# Patient Record
Sex: Female | Born: 1985 | Race: Black or African American | Hispanic: No | Smoking: Never smoker
Health system: Southern US, Community
[De-identification: ages and names within clinical notes are randomized; demographics above are authoritative.]

## PROBLEM LIST (undated history)

## (undated) DIAGNOSIS — L309 Dermatitis, unspecified: Secondary | ICD-10-CM

## (undated) DIAGNOSIS — J45909 Unspecified asthma, uncomplicated: Secondary | ICD-10-CM

## (undated) HISTORY — DX: Dermatitis, unspecified: L30.9

## (undated) HISTORY — DX: Unspecified asthma, uncomplicated: J45.909

---

## 2008-12-08 ENCOUNTER — Emergency Department (HOSPITAL_COMMUNITY): Admission: EM | Admit: 2008-12-08 | Discharge: 2008-12-08 | Payer: Self-pay | Admitting: Family Medicine

## 2009-10-20 ENCOUNTER — Emergency Department (HOSPITAL_COMMUNITY): Admission: EM | Admit: 2009-10-20 | Discharge: 2009-10-20 | Payer: Self-pay | Admitting: Emergency Medicine

## 2009-10-23 ENCOUNTER — Emergency Department (HOSPITAL_COMMUNITY): Admission: EM | Admit: 2009-10-23 | Discharge: 2009-10-23 | Payer: Self-pay | Admitting: Emergency Medicine

## 2010-05-09 LAB — GC/CHLAMYDIA PROBE AMP, GENITAL: Chlamydia, DNA Probe: POSITIVE — AB

## 2010-05-09 LAB — WET PREP, GENITAL: Yeast Wet Prep HPF POC: NONE SEEN

## 2016-09-13 ENCOUNTER — Ambulatory Visit (INDEPENDENT_AMBULATORY_CARE_PROVIDER_SITE_OTHER): Payer: Worker's Compensation

## 2016-09-13 ENCOUNTER — Ambulatory Visit (INDEPENDENT_AMBULATORY_CARE_PROVIDER_SITE_OTHER): Payer: Worker's Compensation | Admitting: Physician Assistant

## 2016-09-13 ENCOUNTER — Encounter: Payer: Self-pay | Admitting: Physician Assistant

## 2016-09-13 VITALS — BP 140/90 | HR 112 | Temp 97.1°F | Resp 16 | Ht 66.0 in | Wt 225.0 lb

## 2016-09-13 DIAGNOSIS — S99912A Unspecified injury of left ankle, initial encounter: Secondary | ICD-10-CM

## 2016-09-13 DIAGNOSIS — T148XXA Other injury of unspecified body region, initial encounter: Secondary | ICD-10-CM

## 2016-09-13 DIAGNOSIS — S93492A Sprain of other ligament of left ankle, initial encounter: Secondary | ICD-10-CM

## 2016-09-13 DIAGNOSIS — M62838 Other muscle spasm: Secondary | ICD-10-CM | POA: Diagnosis not present

## 2016-09-13 MED ORDER — NAPROXEN 500 MG PO TABS: 500.0000 mg | ORAL_TABLET | Freq: Two times a day (BID) | ORAL | 0 refills | Status: DC

## 2016-09-13 MED ORDER — CYCLOBENZAPRINE HCL 5 MG PO TABS: 5.0000 mg | ORAL_TABLET | Freq: Three times a day (TID) | ORAL | 0 refills | Status: DC | PRN

## 2016-09-13 NOTE — Patient Instructions (Addendum)
For the ankle, I want you to wear the ankle brace we gave you in office until symptoms resolve. Elevate the foot above your heart level. Apply ice to affected area 4-5 times per day for 20 minutes at a time. Take naproxen as prescribed twice daily until symptoms resolve. You can use crutches as needed. Advance weight as tolerated. For neck spasm, use muscle relaxant up to three times a day. Follow up with me on 09/17/16 for reevaluation. If any of your symptoms worsen please seek care sooner.   Once your symptoms start to improve, begin the exercises below with your ankle to strengthen the ligaments.  Ankle Sprain An ankle sprain is a stretch or tear in one of the tough tissues (ligaments) in your ankle. Follow these instructions at home:  Rest your ankle.  Take over-the-counter and prescription medicines only as told by your doctor.  For 2-3 days, keep your ankle higher than the level of your heart (elevated) as much as possible.  If directed, put ice on the area: ? Put ice in a plastic bag. ? Place a towel between your skin and the bag. ? Leave the ice on for 20 minutes, 2-3 times a day.  If you were given a brace: ? Wear it as told. ? Take it off to shower or bathe. ? Try not to move your ankle much, but wiggle your toes from time to time. This helps to prevent swelling.  If you were given an elastic bandage (dressing): ? Take it off when you shower or bathe. ? Try not to move your ankle much, but wiggle your toes from time to time. This helps to prevent swelling. ? Adjust the bandage to make it more comfortable if it feels too tight. ? Loosen the bandage if you lose feeling in your foot, your foot tingles, or your foot gets cold and blue.  If you have crutches, use them as told by your doctor. Continue to use them until you can walk without feeling pain in your ankle. Contact a doctor if:  Your bruises or swelling are quickly getting worse.  Your pain does not get better after  you take medicine. Get help right away if:  You cannot feel your toes or foot.  Your toes or your foot looks blue.  You have very bad pain that gets worse. This information is not intended to replace advice given to you by your health care provider. Make sure you discuss any questions you have with your health care provider. Document Released: 07/10/2007 Document Revised: 06/29/2015 Document Reviewed: 08/23/2014 Elsevier Interactive Patient Education  2018 Elsevier Inc.   Ankle Sprain, Phase I Rehab Ask your health care provider which exercises are safe for you. Do exercises exactly as told by your health care provider and adjust them as directed. It is normal to feel mild stretching, pulling, tightness, or discomfort as you do these exercises, but you should stop right away if you feel sudden pain or your pain gets worse.Do not begin these exercises until told by your health care provider. Stretching and range of motion exercises These exercises warm up your muscles and joints and improve the movement and flexibility of your lower leg and ankle. These exercises also help to relieve pain and stiffness. Exercise A: Gastroc and soleus stretch  1. Sit on the floor with your left / right leg extended. 2. Loop a belt or towel around the ball of your left / right foot. The ball of your foot is  on the walking surface, right under your toes. 3. Keep your left / right ankle and foot relaxed and keep your knee straight while you use the belt or towel to pull your foot toward you. You should feel a gentle stretch behind your calf or knee. 4. Hold this position for __________ seconds, then release to the starting position. Repeat the exercise with your knee bent. You can put a pillow or a rolled bath towel under your knee to support it. You should feel a stretch deep in your calf or at your Achilles tendon. Repeat each stretch __________ times. Complete these stretches __________ times a day. Exercise  B: Ankle alphabet  1. Sit with your left / right leg supported at the lower leg. ? Do not rest your foot on anything. ? Make sure your foot has room to move freely. 2. Think of your left / right foot as a paintbrush, and move your foot to trace each letter of the alphabet in the air. Keep your hip and knee still while you trace. Make the letters as large as you can without feeling discomfort. 3. Trace every letter from A to Z. Repeat __________ times. Complete this exercise __________ times a day. Strengthening exercises These exercises build strength and endurance in your ankle and lower leg. Endurance is the ability to use your muscles for a long time, even after they get tired. Exercise C: Dorsiflexors  1. Secure a rubber exercise band or tube to an object, such as a table leg, that will stay still when the band is pulled. Secure the other end around your left / right foot. 2. Sit on the floor facing the object, with your left / right leg extended. The band or tube should be slightly tense when your foot is relaxed. 3. Slowly bring your foot toward you, pulling the band tighter. 4. Hold this position for __________ seconds. 5. Slowly return your foot to the starting position. Repeat __________ times. Complete this exercise __________ times a day. Exercise D: Plantar flexors  1. Sit on the floor with your left / right leg extended. 2. Loop a rubber exercise tube or band around the ball of your left / right foot. The ball of your foot is on the walking surface, right under your toes. ? Hold the ends of the band or tube in your hands. ? The band or tube should be slightly tense when your foot is relaxed. 3. Slowly point your foot and toes downward, pushing them away from you. 4. Hold this position for __________ seconds. 5. Slowly return your foot to the starting position. Repeat __________ times. Complete this exercise __________ times a day. Exercise E: Evertors 1. Sit on the floor  with your legs straight out in front of you. 2. Loop a rubber exercise band or tube around the ball of your left / right foot. The ball of your foot is on the walking surface, right under your toes. ? Hold the ends of the band in your hands, or secure the band to a stable object. ? The band or tube should be slightly tense when your foot is relaxed. 3. Slowly push your foot outward, away from your other leg. 4. Hold this position for __________ seconds. 5. Slowly return your foot to the starting position. Repeat __________ times. Complete this exercise __________ times a day. This information is not intended to replace advice given to you by your health care provider. Make sure you discuss any questions you have with  your health care provider. Document Released: 08/22/2004 Document Revised: 09/28/2015 Document Reviewed: 12/05/2014 Elsevier Interactive Patient Education  2018 Elsevier Inc.   Cervical Strain and Sprain Rehab Ask your health care provider which exercises are safe for you. Do exercises exactly as told by your health care provider and adjust them as directed. It is normal to feel mild stretching, pulling, tightness, or discomfort as you do these exercises, but you should stop right away if you feel sudden pain or your pain gets worse.Do not begin these exercises until told by your health care provider. Stretching and range of motion exercises These exercises warm up your muscles and joints and improve the movement and flexibility of your neck. These exercises also help to relieve pain, numbness, and tingling. Exercise A: Cervical side bend  5. Using good posture, sit on a stable chair or stand up. 6. Without moving your shoulders, slowly tilt your left / right ear to your shoulder until you feel a stretch in your neck muscles. You should be looking straight ahead. 7. Hold for __________ seconds. 8. Repeat with the other side of your neck. Repeat __________ times. Complete this  exercise __________ times a day. Exercise B: Cervical rotation  4. Using good posture, sit on a stable chair or stand up. 5. Slowly turn your head to the side as if you are looking over your left / right shoulder. ? Keep your eyes level with the ground. ? Stop when you feel a stretch along the side and the back of your neck. 6. Hold for __________ seconds. 7. Repeat this by turning to your other side. Repeat __________ times. Complete this exercise __________ times a day. Exercise C: Thoracic extension and pectoral stretch 6. Roll a towel or a small blanket so it is about 4 inches (10 cm) in diameter. 7. Lie down on your back on a firm surface. 8. Put the towel lengthwise, under your spine in the middle of your back. It should not be not under your shoulder blades. The towel should line up with your spine from your middle back to your lower back. 9. Put your hands behind your head and let your elbows fall out to your sides. 10. Hold for __________ seconds. Repeat __________ times. Complete this exercise __________ times a day. Strengthening exercises These exercises build strength and endurance in your neck. Endurance is the ability to use your muscles for a long time, even after your muscles get tired. Exercise D: Upper cervical flexion, isometric 1. Lie on your back with a thin pillow behind your head and a small rolled-up towel under your neck. 2. Gently tuck your chin toward your chest and nod your head down to look toward your feet. Do not lift your head off the pillow. 3. Hold for __________ seconds. 4. Release the tension slowly. Relax your neck muscles completely before you repeat this exercise. Repeat __________ times. Complete this exercise __________ times a day. Exercise E: Cervical extension, isometric  1. Stand about 6 inches (15 cm) away from a wall, with your back facing the wall. 2. Place a soft object, about 6-8 inches (15-20 cm) in diameter, between the back of your  head and the wall. A soft object could be a small pillow, a ball, or a folded towel. 3. Gently tilt your head back and press into the soft object. Keep your jaw and forehead relaxed. 4. Hold for __________ seconds. 5. Release the tension slowly. Relax your neck muscles completely before you repeat this exercise.  Repeat __________ times. Complete this exercise __________ times a day. Posture and body mechanics  Body mechanics refers to the movements and positions of your body while you do your daily activities. Posture is part of body mechanics. Good posture and healthy body mechanics can help to relieve stress in your body's tissues and joints. Good posture means that your spine is in its natural S-curve position (your spine is neutral), your shoulders are pulled back slightly, and your head is not tipped forward. The following are general guidelines for applying improved posture and body mechanics to your everyday activities. Standing  When standing, keep your spine neutral and keep your feet about hip-width apart. Keep a slight bend in your knees. Your ears, shoulders, and hips should line up.  When you do a task in which you stand in one place for a long time, place one foot up on a stable object that is 2-4 inches (5-10 cm) high, such as a footstool. This helps keep your spine neutral. Sitting   When sitting, keep your spine neutral and your keep feet flat on the floor. Use a footrest, if necessary, and keep your thighs parallel to the floor. Avoid rounding your shoulders, and avoid tilting your head forward.  When working at a desk or a computer, keep your desk at a height where your hands are slightly lower than your elbows. Slide your chair under your desk so you are close enough to maintain good posture.  When working at a computer, place your monitor at a height where you are looking straight ahead and you do not have to tilt your head forward or downward to look at the  screen. Resting When lying down and resting, avoid positions that are most painful for you. Try to support your neck in a neutral position. You can use a contour pillow or a small rolled-up towel. Your pillow should support your neck but not push on it. This information is not intended to replace advice given to you by your health care provider. Make sure you discuss any questions you have with your health care provider. Document Released: 01/21/2005 Document Revised: 09/28/2015 Document Reviewed: 12/28/2014 Elsevier Interactive Patient Education  2018 [employer].   IF you received an x-ray today, you will receive an invoice from [facility]. Please contact [facility] at [phone] with questions or concerns regarding your invoice.   IF you received labwork today, you will receive an invoice from [facility]. Please contact [facility] at [phone] with questions or concerns regarding your invoice.   Our billing staff will not be able to assist you with questions regarding bills from these companies.  You will be contacted with the lab results as soon as they are available. The fastest way to get your results is to activate your My Chart account. Instructions are located on the last page of this paperwork. If you have not heard from [place] regarding the results in 2 weeks, please contact this office.

## 2016-09-13 NOTE — Progress Notes (Signed)
Debbie Solis  MRN: [id number] DOB: 09/18/1985  Subjective:   Debbie Solis is a 31 y.o. female who presents with left ankle pain. Onset of the symptoms was today. Inciting event: inverted while walking up stairs at work in heels. Current symptoms include: inability to bear weight and swelling. Aggravating factors: direct pressure and walking . Symptoms have stabilized. Patient has had no prior ankle problems. Evaluation to date: none. Treatment to date: none. Pt also have neck tightness on right side. She denies loss of ROM. She also has a minor abrasion on right hand. No pain or swelling of hand.   Review of Systems  Eyes: Negative for visual disturbance.  Neurological: Negative for dizziness, weakness, numbness and headaches.   Debbie Solis's medications list, allergies, past medical history and past surgical history were reviewed and excluded from this note due to being a worker's comp case.     Objective:  BP 140/90   Pulse (!) 112   Temp (!) 97.1 F (36.2 C) (Oral)   Resp 16   Ht 5\' 6"  (1.676 m)   Wt 225 lb (102.1 kg)   LMP 09/05/2016   BMI 36.32 kg/m   Physical Exam  Constitutional: She is oriented to person, place, and time and well-developed, well-nourished, and in no distress.  HENT:  Head: Normocephalic and atraumatic.  Eyes: Pupils are equal, round, and reactive to light. Conjunctivae and EOM are normal.  Neck: Normal range of motion and full passive range of motion without pain. Muscular tenderness (and spasm noted in right sided musculature) present. No spinous process tenderness present. No neck rigidity. No erythema and normal range of motion present.  Pulmonary/Chest: Effort normal.  Musculoskeletal:       Right wrist: Normal.       Left wrist: Normal.       Left ankle: She exhibits decreased range of motion and swelling (mild over ATFL). She exhibits no ecchymosis and normal pulse. Tenderness. AITFL tenderness found. No lateral malleolus, no medial malleolus and no  head of 5th metatarsal tenderness found. Achilles tendon normal.       Right hand: She exhibits normal range of motion, no tenderness and normal capillary refill. Normal sensation noted. Normal strength noted.       Left hand: Normal.       Left foot: There is decreased range of motion. There is no tenderness and normal capillary refill.  Neurological: She is alert and oriented to person, place, and time. Gait normal.  Skin: Skin is warm and dry. Abrasion (small abrasion noted on palmar aspect of right hand) noted.  Psychiatric: Affect normal.  Vitals reviewed.  Mupirocin ointment and bandaid applied to hand abrasion.  Assessment and Plan :  1. Injury of left ankle, initial encounter - DG Foot Complete Left; Future - DG Ankle Complete Left; Future  2. Sprain of anterior talofibular ligament of left ankle, initial encounter No acute findings on plain films. Consistent with ankle sprain. Will treat with P.R.I.C.E principles. Ankle brace applied and crutches given in office. Given educational material for ankle exercises to start once pain resolves. Work restrictions include weight bearing activities. Pt will be working from home until follow up appointment. Follow up on 09/17/16 for reevaluation. - naproxen (NAPROSYN) 500 MG tablet; Take 1 tablet (500 mg total) by mouth 2 (two) times daily with a meal.  Dispense: 30 tablet; Refill: 0 -Apply ASO ankle  3. Neck muscle spasm Encouraged to hydrate. Given educational material for neck stretches. Muscle relaxant prn.  -  cyclobenzaprine (FLEXERIL) 5 MG tablet; Take 1 tablet (5 mg total) by mouth 3 (three) times daily as needed for muscle spasms.  Dispense: 60 tablet; Refill: 0 4 4. Abrasion Wound care instructions given to patient.   Debbie Core PA-C  [facility] 09/13/2016 7:26 PM

## 2016-09-16 ENCOUNTER — Encounter: Payer: Self-pay | Admitting: Physician Assistant

## 2016-09-17 ENCOUNTER — Ambulatory Visit (INDEPENDENT_AMBULATORY_CARE_PROVIDER_SITE_OTHER): Payer: Worker's Compensation | Admitting: Physician Assistant

## 2016-09-17 ENCOUNTER — Encounter: Payer: Self-pay | Admitting: Physician Assistant

## 2016-09-17 VITALS — BP 143/87 | HR 99 | Temp 98.6°F | Resp 17 | Ht 66.0 in | Wt 230.0 lb

## 2016-09-17 DIAGNOSIS — S93492D Sprain of other ligament of left ankle, subsequent encounter: Secondary | ICD-10-CM | POA: Diagnosis not present

## 2016-09-17 DIAGNOSIS — S99912D Unspecified injury of left ankle, subsequent encounter: Secondary | ICD-10-CM | POA: Diagnosis not present

## 2016-09-17 NOTE — Progress Notes (Signed)
MRN: [id number] DOB: [birth year]  Subjective:   Debbie Solis is a 31 y.o. female presenting for chief complaint of Follow-up (Ankle injury left)  Pt initially seen in office on 09/13/16 for left ankle injury. Plain films of left ankle and foot were negative for acute fracture. She was treated for left ankle sprain with P.R.I.C.E principles. Given Rx for naproxen for ankle pain and swelling and for flexeril for neck spasms. Today, she notes the ankle pain and swelling have improved. Pain was 8/10, now it is 4/10. 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 This did irritate her ankle. Woke up this morning and it was sore again. Has been wearing brace, icing, and elevating as instructed. Did some ankle ROM exercises over the weekend without pain. Using naproxen prn. 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 Denies numbness, tingling, and worsening pain.  In terms of her neck stiffness, she notes that has completley improved, had to use muslce relaxant twice.   Debbie Solis's medications list, allergies, past medical history and past surgical history were reviewed and excluded from this note due to being a worker's comp case.   Objective:   Vitals: BP (!) 143/87   Pulse 99   Temp 98.6 F (37 C) (Oral)   Resp 17   Ht 5\' 6"  (1.676 m)   Wt 230 lb (104.3 kg)   LMP 09/05/2016   SpO2 98%   BMI 37.12 kg/m   Physical Exam  Constitutional: She is oriented to person, place, and time. She appears well-developed and well-nourished.  HENT:  Head: Normocephalic and atraumatic.  Eyes: Conjunctivae are normal.  Neck: Normal range of motion.  Cardiovascular:  Pulses:      Dorsalis pedis pulses are 2+ on the right side, and 2+ on the left  side.       Posterior tibial pulses are 2+ on the right side, and 2+ on the left side.  Pulmonary/Chest: Effort normal.  Musculoskeletal:       Left ankle: She exhibits swelling (mild but improved since initial visit) and ecchymosis (noted over ATFL and at base of 3rd and 4th toe). She exhibits normal range of motion and normal pulse. Tenderness. AITFL (mild) tenderness found. No lateral malleolus and no medial malleolus tenderness found. Achilles tendon normal.       Left foot: There is tenderness (mild with palpation of metatarsals). There is normal range of motion and normal capillary refill.  Neurological: She is alert and oriented to person, place, and time.  Using crutches to assist with ambulation.   Skin: Skin is warm and dry.  Psychiatric: She has a normal mood and affect.  Vitals reviewed.   No results found for this or any previous visit (from the past 24 hour(s)).  Assessment and Plan :  1. Injury of left ankle, subsequent encounter 2. Sprain of anterior talofibular ligament of left ankle, subsequent encounter Pt improving. Due to the fact that she has to start school this week and will not be able to ambulate easily with crutches on campus due to construction, cam walker given to pt in office. Will only use cam walker as needed for the next week. Also filled out paperwork for  temporary handicap placard to use at campus over the next few weeks. Instructed while at home to use ankle brace as needed and continue icing and elevating. Also encouraged to continue ROM exercises. Continue weight bearing as tolerated. Work restrictions given. Plan to follow up in one week for reevaluation.  - Apply cam walker  Benjiman CoreBrittany Jenee Spaugh, PA-C  [facility] 09/17/2016 11:54 AM

## 2016-09-17 NOTE — Patient Instructions (Addendum)
I would like you to use the CAM Walker while on campus for the next week. While you are at home make sure you are elevating your leg, icing, and doing ankle exercises as tolerated. Use naproxen as needed for inflammation and pain. While at work, elevate your foot every few hours for at least 30 minutes at a time. Apply ice at work if needed. Start bearing weight on ankle as tolerated. Follow up in one week for reevaluation. Thank you for letting me participate in your health and well being.  Ankle Sprain An ankle sprain is a stretch or tear in one of the tough tissues (ligaments) in your ankle. Follow these instructions at home:  Rest your ankle.  Take over-the-counter and prescription medicines only as told by your doctor.  For 2-3 days, keep your ankle higher than the level of your heart (elevated) as much as possible.  If directed, put ice on the area: ? Put ice in a plastic bag. ? Place a towel between your skin and the bag. ? Leave the ice on for 20 minutes, 2-3 times a day.  If you were given a brace: ? Wear it as told. ? Take it off to shower or bathe. ? Try not to move your ankle much, but wiggle your toes from time to time. This helps to prevent swelling.  If you were given an elastic bandage (dressing): ? Take it off when you shower or bathe. ? Try not to move your ankle much, but wiggle your toes from time to time. This helps to prevent swelling. ? Adjust the bandage to make it more comfortable if it feels too tight. ? Loosen the bandage if you lose feeling in your foot, your foot tingles, or your foot gets cold and blue.  If you have crutches, use them as told by your doctor. Continue to use them until you can walk without feeling pain in your ankle. Contact a doctor if:  Your bruises or swelling are quickly getting worse.  Your pain does not get better after you take medicine. Get help right away if:  You cannot feel your toes or foot.  Your toes or your foot looks  blue.  You have very bad pain that gets worse. This information is not intended to replace advice given to you by your health care provider. Make sure you discuss any questions you have with your health care provider. Document Released: 07/10/2007 Document Revised: 06/29/2015 Document Reviewed: 08/23/2014 Elsevier Interactive Patient Education  2018 [employer]     IF you received an x-ray today, you will receive an invoice from [facility]. Please contact [facility] at [phone] with questions or concerns regarding your invoice.   IF you received labwork today, you will receive an invoice from [facility]. Please contact [facility] at [phone] with questions or concerns regarding your invoice.   Our billing staff will not be able to assist you with questions regarding bills from these companies.  You will be contacted with the lab results as soon as they are available. The fastest way to get your results is to activate your My Chart account. Instructions are located on the last page of this paperwork. If you have not heard from us regarding the results in 2 weeks, please contact this office.

## 2016-09-24 ENCOUNTER — Other Ambulatory Visit: Payer: Self-pay | Admitting: Physician Assistant

## 2016-09-24 DIAGNOSIS — S93492A Sprain of other ligament of left ankle, initial encounter: Secondary | ICD-10-CM

## 2016-10-02 ENCOUNTER — Ambulatory Visit (INDEPENDENT_AMBULATORY_CARE_PROVIDER_SITE_OTHER): Payer: Worker's Compensation

## 2016-10-02 ENCOUNTER — Ambulatory Visit (INDEPENDENT_AMBULATORY_CARE_PROVIDER_SITE_OTHER): Payer: Worker's Compensation | Admitting: Physician Assistant

## 2016-10-02 ENCOUNTER — Encounter: Payer: Self-pay | Admitting: Physician Assistant

## 2016-10-02 VITALS — BP 123/85 | HR 80 | Temp 98.2°F | Resp 18 | Ht 66.0 in | Wt 229.2 lb

## 2016-10-02 DIAGNOSIS — S93492D Sprain of other ligament of left ankle, subsequent encounter: Secondary | ICD-10-CM

## 2016-10-02 DIAGNOSIS — M79672 Pain in left foot: Secondary | ICD-10-CM

## 2016-10-02 DIAGNOSIS — S99912D Unspecified injury of left ankle, subsequent encounter: Secondary | ICD-10-CM | POA: Diagnosis not present

## 2016-10-02 NOTE — Patient Instructions (Addendum)
Your plain films are still negative for acute fracture, which is great news. I recommend using the ASO wrap when walking around for long periods at a time. I would also continue with daily naproxen at least for the next week. Continue resting, icing, and elevating until swelling and pain resolves. I recommend performing ankle exercises given at previous office visits. Follow up as needed. Thank you for letting me participate in your health and well being.    IF you received an x-ray today, you will receive an invoice from [facility]. Please contact [facility] at [phone] with questions or concerns regarding your invoice.   IF you received labwork today, you will receive an invoice from [facility]. Please contact [facility] at [phone] with questions or concerns regarding your invoice.   Our billing staff will not be able to assist you with questions regarding bills from these companies.  You will be contacted with the lab results as soon as they are available. The fastest way to get your results is to activate your My Chart account. Instructions are located on the last page of this paperwork. If you have not heard from us regarding the results in 2 weeks, please contact this office.

## 2016-10-02 NOTE — Progress Notes (Signed)
MRN: [id number] DOB: 08/25/1985  Subjective:   Debbie Solis is a 31 y.o. female presenting for follow up on ankle injury. Pt initially seen in office on 09/13/16 for left ankle injury. Plain films of left ankle and foot were negative for acute fracture. She was treated for left ankle sprain with P.R.I.C.E principles. Given Rx for naproxen for ankle pain and swelling and for flexeril for neck spasms. 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 Also, had to park far away from campus due to construction. Given a temporary short cam walker to use for one week and a temporary handicap placard so she could park closer to campus. Encouraged to continue ROM exercises, elevation, and ice. Return in one week for reevaluation. Today, pt notes her symptoms have improved but she still notices left foot pain and swelling, especially at the end of the day after being on her feet all day. She has not had to use crutches since her last visit. She stoped using cam walker a few days ago. Also, stopped using compression aso a few days ago. Has not continued with naproxen. Will ice and elevate occasionally but not regularly. Had tried ankle ROM exercises occasionally. Denies numbness, tingling, and worsening pain.   Layan's medications list, allergies, past medical history and past surgical history were reviewed and excluded from this note due to being a worker's comp case.    Objective:   Vitals: BP 123/85 (BP Location: Left Arm, Patient Position: Sitting, Cuff Size: Large)   Pulse 80   Temp 98.2 F (36.8 C) (Oral)   Resp 18   Ht 5\' 6"  (1.676 m)   Wt 229 lb 3.2 oz (104 kg)   LMP 09/05/2016 (Approximate)   SpO2 99%   BMI 36.99 kg/m   Physical Exam  Constitutional: She is oriented to person, place, and time. She appears well-developed and well-nourished.  HENT:  Head: Normocephalic  and atraumatic.  Eyes: Conjunctivae are normal.  Neck: Normal range of motion.  Cardiovascular:  Pulses:      Dorsalis pedis pulses are 2+ on the right side, and 2+ on the left side.       Posterior tibial pulses are 2+ on the right side, and 2+ on the left side.  Pulmonary/Chest: Effort normal.  Musculoskeletal:       Left ankle: She exhibits normal range of motion, no swelling and no ecchymosis. Tenderness. AITFL (mild) tenderness found. No lateral malleolus and no medial malleolus tenderness found. Achilles tendon normal.       Left foot: There is bony tenderness (with palpation of 4th and 5th metatarsal) and swelling (mild over midfoot). There is normal range of motion and normal capillary refill.  Neurological: She is alert and oriented to person, place, and time.  Skin: Skin is warm and dry.  Psychiatric: She has a normal mood and affect.  Vitals reviewed.   No results found for this or any previous visit (from the past 24 hour(s)).  Dg Foot Complete Left  Result Date: 10/02/2016 CLINICAL DATA:  Continued fourth and fifth metatarsal pain after ankle inversion injury 3 weeks ago. EXAM: LEFT FOOT - COMPLETE 3+ VIEW COMPARISON:  Left foot x-rays dated September 13, 2016. FINDINGS: There is no evidence of fracture or dislocation. There is no evidence of arthropathy or other focal bone abnormality. Soft tissues are unremarkable. IMPRESSION: Negative.  Electronically Signed   By: Obie Dredge M.D.   On: 10/02/2016 12:42     Assessment and Plan :  1. Left foot pain 2. Injury of left ankle, subsequent encounter 3. Sprain of anterior talofibular ligament of left ankle, subsequent encounter Repeated imaging due to hx of continued pain, swelling, and PE findings of tenderness with palpation of 4th and 5th metatarsals. There was still no acute fx noted on exam, which is reassuring. I have encouraged patient to continue wearing aso brace as needed for support and swelling, especially if she is  walking a lot on campus. I have also encouraged her to take naproxen daily for at least the next week to help with swelling and pain. Continue rest, ice, elevation, and ROM exercises. Plan to follow up 4 weeks. Expect sx to have fully resolved by that time. If no full improvement at that time, consider referral to orthopedics. Return sooner if symptoms worsen or if they stop gradually improving.  - DG Foot Complete Left; Future  Benjiman Core, PA-C  Primary Care at [facility] 10/03/2016 11:01 PM

## 2016-10-26 ENCOUNTER — Ambulatory Visit (INDEPENDENT_AMBULATORY_CARE_PROVIDER_SITE_OTHER): Payer: Worker's Compensation | Admitting: Physician Assistant

## 2016-10-26 ENCOUNTER — Encounter: Payer: Self-pay | Admitting: Physician Assistant

## 2016-10-26 VITALS — BP 127/85 | HR 88 | Temp 98.4°F | Resp 16 | Ht 64.0 in | Wt 234.8 lb

## 2016-10-26 DIAGNOSIS — G8929 Other chronic pain: Secondary | ICD-10-CM | POA: Diagnosis not present

## 2016-10-26 DIAGNOSIS — M79672 Pain in left foot: Secondary | ICD-10-CM

## 2016-10-26 DIAGNOSIS — M25572 Pain in left ankle and joints of left foot: Secondary | ICD-10-CM | POA: Diagnosis not present

## 2016-10-26 NOTE — Progress Notes (Deleted)
    MRN: [id number] DOB: 1985/05/28  Subjective:   Debbie Solis is a 31 y.o. female presenting for follow up on ***.     Debbie Solis currently has no medications in their medication list. Also is allergic to alka-seltzer heartburn [sodium bicarbonate-citric acid].  Debbie Solis  has no past medical history on file. Also  has no past surgical history on file.   Objective:   Vitals: BP 127/85   Pulse 88   Temp 98.4 F (36.9 C) (Oral)   Resp 16   Ht  (1.626 m)   Wt 234 lb 12.8 oz (106.5 kg)   LMP 10/05/2016   SpO2 97%   BMI 40.30 kg/m   Physical Exam  No results found for this or any previous visit (from the past 24 hour(s)).  Assessment and Plan :  There are no diagnoses linked to this encounter.  Benjiman Core, PA-C  [facility] 10/26/2016 11:29 AM

## 2016-10-26 NOTE — Patient Instructions (Addendum)
   Please go pick up compression stockings and wear those while sitting or standing for long periods. Continue icing and elevating. Avoid brace use if it is worsening the pain. Follow up with orthopedics. They should contact you within a week to schedule an appointment. Thank you for letting me participate in your health and well being.  [facility] [employer] for compression stockings Address: [address], [place], [place] [address]  Phone: [phone]    IF you received an x-ray today, you will receive an invoice from [facility]. Please contact [facility] at [phone] with questions or concerns regarding your invoice.   IF you received labwork today, you will receive an invoice from [facility]. Please contact [facility] at [phone] with questions or concerns regarding your invoice.   Our billing staff will not be able to assist you with questions regarding bills from these companies.  You will be contacted with the lab results as soon as they are available. The fastest way to get your results is to activate your My Chart account. Instructions are located on the last page of this paperwork. If you have not heard from [place] regarding the results in 2 weeks, please contact this office.

## 2016-10-26 NOTE — Progress Notes (Deleted)
   Debbie Solis  MRN: [id number] DOB: [birth year]  Subjective:  Debbie Solis is a 31 y.o. female seen in office today for a chief complaint of ***  Review of Systems  There are no active problems to display for this patient.   No current outpatient prescriptions on file prior to visit.   No current facility-administered medications on file prior to visit.     Allergies  Allergen Reactions  . Alka-Seltzer Heartburn [Sodium Bicarbonate-Citric Acid] Swelling     Objective:  BP 127/85   Pulse 88   Temp 98.4 F (36.9 C) (Oral)   Resp 16   Ht  (1.626 m)   Wt 234 lb 12.8 oz (106.5 kg)   LMP 10/05/2016   SpO2 97%   BMI 40.30 kg/m   Physical Exam  Assessment and Plan :  *** 1. Left foot pain *** - Ambulatory referral to Orthopedic Surgery    Benjiman Core PA-C  Primary Care at [facility] 10/26/2016 12:34 PM

## 2016-10-26 NOTE — Progress Notes (Signed)
MRN: [id number] DOB: Nov 07, 1985  Subjective:   Debbie Solis is a 31 y.o. female presenting for chief complaint of Foot Pain (WC follow-up, pt states her foot does not feel any better. Pt states the joint where the ankle and foot meet is giving her the most pain when she stands. )   Pt initially seen in office on 09/13/16 for left ankle injury. Plain films of left ankle and foot were negative for acute fracture. She was treated for left ankle sprain with P.R.I.C.E principles. Given Rx for naproxen for ankle pain and swelling and for flexeril for neck spasms. 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 Also, had to park far away from campus due to construction. Given a temporary short cam walker to use for one week and a temporary handicap placard so she could park closer to campus. Encouraged to continue ROM exercises, elevation, and ice. Returned on 10/03/26 with improved symptoms but continued left foot pain and swelling, especially at the end of the day after being on her feet all day. Reimaged the foot to bony tenderness on exam and reimaging showed no evidence or fracture of dislocation. Today, pt reports she is still having pain deep in her left ankle joint. It is present with any weight bearing activity, although she can bear weight. It is worse after being on her feet all day. Has been using the brace and thinks it makes the pain worse. She still has swelling and mild bruising. Notes she also gets right foot, right leg, and left leg swelling at the end of the day. Pt sits for long periods at a time at her job then walks for long periods when she is on campus for school. Notes the swelling is not present in the morning. Denies redness, warmth, numbness, and tingling. Has continued using ice and elevating left ankle as discussed. Has continued ROM exercises and notes that they do  not cause any pain. Has not had to use NSAIDs for pain.     Avari's medications list, allergies, past medical history and past surgical history were reviewed and excluded from this note due to being a worker's comp case.   Objective:   Vitals: BP 127/85   Pulse 88   Temp 98.4 F (36.9 C) (Oral)   Resp 16   Ht  (1.626 m)   Wt 234 lb 12.8 oz (106.5 kg)   LMP 10/05/2016   SpO2 97%   BMI 40.30 kg/m   Physical Exam  Constitutional: She is oriented to person, place, and time. She appears well-developed and well-nourished.  HENT:  Head: Normocephalic and atraumatic.  Eyes: Conjunctivae are normal.  Neck: Normal range of motion.  Pulmonary/Chest: Effort normal.  Musculoskeletal:       Left ankle: She exhibits swelling (mild overlying ATFL) and ecchymosis (mild overlying ATFL). She exhibits normal range of motion and normal pulse. No tenderness. No lateral malleolus, no medial malleolus and no AITFL tenderness found.       Right lower leg: Normal. She exhibits no tenderness and no swelling.       Left lower leg: She exhibits no tenderness and no swelling.       Left foot: There is tenderness (mild with palpation). There is normal range of motion and normal capillary refill.  Neurological: She is alert and oriented to person, place, and  time.  Skin: Skin is warm and dry.  Psychiatric: She has a normal mood and affect.  Vitals reviewed.   No results found for this or any previous visit (from the past 24 hour(s)).  Assessment and Plan :  1. Left foot pain 2. Chronic pain of left ankle Discontinue ankle brace as that is worsening symptoms. Concerned that pt has not had full improvement by now. Will refer to ortho for further evaluation. Continue icing and elevating. NSAIDs prn. Recommend using compression stockings when sitting/standing for prolonged periods of time. Given written Rx for compression stockings. Given temporary handicap card for 1 more month to help with parking at  campus. Follow up with orthopedics.  - Ambulatory referral to Orthopedic Surgery  Benjiman Core, PA-C  Primary Care at [facility] 10/26/2016 12:43 PM

## 2016-12-31 ENCOUNTER — Emergency Department (HOSPITAL_COMMUNITY)
Admission: EM | Admit: 2016-12-31 | Discharge: 2016-12-31 | Disposition: A | Discharge status: Home / Self Care | Payer: PRIVATE HEALTH INSURANCE | Attending: Emergency Medicine | Admitting: Emergency Medicine

## 2016-12-31 ENCOUNTER — Encounter (HOSPITAL_COMMUNITY): Payer: Self-pay | Admitting: Emergency Medicine

## 2016-12-31 DIAGNOSIS — M7918 Myalgia, other site: Secondary | ICD-10-CM | POA: Diagnosis not present

## 2016-12-31 DIAGNOSIS — Z79899 Other long term (current) drug therapy: Secondary | ICD-10-CM | POA: Diagnosis not present

## 2016-12-31 DIAGNOSIS — H538 Other visual disturbances: Secondary | ICD-10-CM | POA: Insufficient documentation

## 2016-12-31 DIAGNOSIS — R51 Headache: Secondary | ICD-10-CM | POA: Diagnosis not present

## 2016-12-31 DIAGNOSIS — I10 Essential (primary) hypertension: Secondary | ICD-10-CM | POA: Insufficient documentation

## 2016-12-31 LAB — I-STAT CHEM 8, ED
BUN: 8 mg/dL (ref 6–20)
CHLORIDE: 102 mmol/L (ref 101–111)
Calcium, Ion: 1.31 mmol/L (ref 1.15–1.40)
Creatinine, Ser: 0.7 mg/dL (ref 0.44–1.00)
GLUCOSE: 98 mg/dL (ref 65–99)
HEMATOCRIT: 39 % (ref 36.0–46.0)
Hemoglobin: 13.3 g/dL (ref 12.0–15.0)
POTASSIUM: 3.5 mmol/L (ref 3.5–5.1)
Sodium: 140 mmol/L (ref 135–145)
TCO2: 28 mmol/L (ref 22–32)

## 2016-12-31 MED ORDER — HYDROCHLOROTHIAZIDE 25 MG PO TABS: 25.0000 mg | ORAL_TABLET | Freq: Every day | ORAL | 0 refills | Status: DC

## 2016-12-31 NOTE — Discharge Instructions (Signed)
Follow-up with the primary care doctor for further management of your high blood pressure.

## 2016-12-31 NOTE — ED Triage Notes (Addendum)
Patient here from home with complaints of "not feeling well" for 3 days. States that she checked [facility] BP at [facility] and it was elevated. No hx of hypertension. Reports fatigue, headache, and generalized body aches.

## 2016-12-31 NOTE — ED Provider Notes (Signed)
[facility]-EMERGENCY DEPT Provider Note   CSN: [id number] Arrival date & time: 12/31/16  1944     History   Chief Complaint Chief Complaint  Patient presents with  . Hypertension  . Generalized Body Aches  . Headache    HPI Debbie Solis is a 31 y.o. female.  HPI Patient has not felt well for the last 3 days.  [facility] today and checked her blood pressure and it was elevated.  No known history of hypertension.  Says she is felt bad.  Has been having dull headaches in the front of her head.  States they have come on in the morning which is unusual for her.  States she usually gets them at the end of the day.  States it went away with Motrin but the headache came back.  No cough.  States she looked at the blackboard today and was a little blurry.  No dysuria.  No chest pain.  No trouble breathing.  No sick contacts.  No sore throat.  Has had slight myalgias.  No localizing numbness or weakness. History reviewed. No pertinent past medical history.  There are no active problems to display for this patient.   History reviewed. No pertinent surgical history.  OB History    No data available       Home Medications    Prior to Admission medications   Medication Sig Start Date End Date Taking? Authorizing Provider  cholecalciferol (VITAMIN D) 1000 units tablet Take 1,000 Units by mouth daily.   Yes [provider]  vitamin B-12 (CYANOCOBALAMIN) 1000 MCG tablet Take 1,000 mcg by mouth daily.   Yes [provider]  Vitamin Mixture (ESTER-C PO) Take 1 tablet by mouth daily.   Yes [provider]  hydrochlorothiazide (HYDRODIURIL) 25 MG tablet Take 1 tablet (25 mg total) by mouth daily. 12/31/16   Benjiman CorePickering, Matilda Fleig, MD    Family History Family History  Problem Relation Age of Onset  . Hypertension Mother   . Hypertension Father     Social History Social History   Tobacco Use  . Smoking status: Never Smoker    . Smokeless tobacco: Never Used  Substance Use Topics  . Alcohol use: No  . Drug use: No     Allergies   Alka-seltzer heartburn [sodium bicarbonate-citric acid]; Latex; and Ultrasound gel   Review of Systems Review of Systems  Constitutional: Positive for fatigue. Negative for appetite change.  HENT: Negative for congestion.   Eyes: Positive for visual disturbance.  Respiratory: Negative for shortness of breath.   Cardiovascular: Negative for chest pain.  Gastrointestinal: Negative for abdominal pain.  Endocrine: Negative for polyuria.  Genitourinary: Negative for flank pain.  Musculoskeletal: Positive for myalgias. Negative for back pain.  Skin: Negative for rash.  Neurological: Positive for headaches.  Hematological: Negative for adenopathy.  Psychiatric/Behavioral: Negative for confusion.     Physical Exam Updated Vital Signs BP (!) 124/105   Pulse 98   Temp 99.6 F (37.6 C) (Oral)   Resp (!) 22   LMP 12/12/2016   SpO2 100%   Physical Exam  Constitutional: She is oriented to person, place, and time. She appears well-developed.  Patient is overweight  HENT:  Head: Atraumatic.  Eyes: EOM are normal. Pupils are equal, round, and reactive to light.  Neck: Neck supple.  Cardiovascular:  Mild tachycardia  Pulmonary/Chest: Effort normal.  Abdominal: Soft.  Neurological: She is alert and oriented to person, place, and time. GCS  eye subscore is 4. GCS verbal subscore is 5. GCS motor subscore is 6.  Skin: Skin is warm. Capillary refill takes less than 2 seconds.     ED Treatments / Results  Labs (all labs ordered are listed, but only abnormal results are displayed) Labs Reviewed  I-STAT CHEM 8, ED    EKG  EKG Interpretation  Date/Time:  Tuesday December 31 2016 22:04:30 EST Ventricular Rate:  92 PR Interval:    QRS Duration: 73 QT Interval:  321 QTC Calculation: 397 R Axis:   54 Text Interpretation:  Sinus rhythm Confirmed by Benjiman CorePickering, Vash Quezada  [phone]([id number]) on 12/31/2016 10:23:09 PM       Radiology No results found.  Procedures Procedures (including critical care time)  Medications Ordered in ED Medications - No data to display   Initial Impression / Assessment and Plan / ED Course  I have reviewed the triage vital signs and the nursing notes.  Pertinent labs & imaging results that were available during my care of the patient were reviewed by me and considered in my medical decision making (see chart for details).     Patient with headache.  Hypertension.  Also generally feeling bad.  His blood pressure initially elevated but has come down some to around 120/100.  Initially was 187/100.  EKG reassuring and good renal function.  Doubt severe endorgan damage.  Will start on hydrochlorothiazide but will need to follow with primary care doctor.  Also with a generally feeling bad potentially could be viral syndrome or something else starting.  Final Clinical Impressions(s) / ED Diagnoses   Final diagnoses:  Hypertension, unspecified type    ED Discharge Orders        Ordered    hydrochlorothiazide (HYDRODIURIL) 25 MG tablet  Daily     12/31/16 2258       Benjiman CorePickering, Keyarah Mcroy, MD 12/31/16 2309

## 2017-09-26 ENCOUNTER — Other Ambulatory Visit: Payer: Self-pay

## 2017-09-26 ENCOUNTER — Emergency Department (HOSPITAL_COMMUNITY): Payer: PRIVATE HEALTH INSURANCE

## 2017-09-26 ENCOUNTER — Emergency Department (HOSPITAL_COMMUNITY)
Admission: EM | Admit: 2017-09-26 | Discharge: 2017-09-26 | Disposition: A | Discharge status: Home / Self Care | Payer: PRIVATE HEALTH INSURANCE | Attending: Emergency Medicine | Admitting: Emergency Medicine

## 2017-09-26 ENCOUNTER — Encounter (HOSPITAL_COMMUNITY): Payer: Self-pay

## 2017-09-26 DIAGNOSIS — H9319 Tinnitus, unspecified ear: Secondary | ICD-10-CM | POA: Diagnosis not present

## 2017-09-26 DIAGNOSIS — R110 Nausea: Secondary | ICD-10-CM | POA: Insufficient documentation

## 2017-09-26 DIAGNOSIS — M25512 Pain in left shoulder: Secondary | ICD-10-CM | POA: Insufficient documentation

## 2017-09-26 DIAGNOSIS — Z79899 Other long term (current) drug therapy: Secondary | ICD-10-CM | POA: Diagnosis not present

## 2017-09-26 DIAGNOSIS — N3001 Acute cystitis with hematuria: Secondary | ICD-10-CM | POA: Insufficient documentation

## 2017-09-26 DIAGNOSIS — M542 Cervicalgia: Secondary | ICD-10-CM | POA: Diagnosis not present

## 2017-09-26 DIAGNOSIS — Z733 Stress, not elsewhere classified: Secondary | ICD-10-CM | POA: Insufficient documentation

## 2017-09-26 DIAGNOSIS — R5383 Other fatigue: Secondary | ICD-10-CM | POA: Insufficient documentation

## 2017-09-26 DIAGNOSIS — Z91040 Latex allergy status: Secondary | ICD-10-CM | POA: Diagnosis not present

## 2017-09-26 DIAGNOSIS — R51 Headache: Secondary | ICD-10-CM | POA: Diagnosis not present

## 2017-09-26 DIAGNOSIS — R030 Elevated blood-pressure reading, without diagnosis of hypertension: Secondary | ICD-10-CM | POA: Diagnosis present

## 2017-09-26 DIAGNOSIS — H9203 Otalgia, bilateral: Secondary | ICD-10-CM | POA: Insufficient documentation

## 2017-09-26 DIAGNOSIS — R519 Headache, unspecified: Secondary | ICD-10-CM

## 2017-09-26 DIAGNOSIS — R109 Unspecified abdominal pain: Secondary | ICD-10-CM | POA: Diagnosis not present

## 2017-09-26 LAB — BASIC METABOLIC PANEL
ANION GAP: 9 (ref 5–15)
BUN: 7 mg/dL (ref 6–20)
CO2: 25 mmol/L (ref 22–32)
CREATININE: 0.72 mg/dL (ref 0.44–1.00)
Calcium: 9 mg/dL (ref 8.9–10.3)
Chloride: 105 mmol/L (ref 98–111)
GFR calc non Af Amer: >60 mL/min (ref >60)
Glucose, Bld: 112 mg/dL — ABNORMAL HIGH (ref 70–99)
Potassium: 3.8 mmol/L (ref 3.5–5.1)
Sodium: 139 mmol/L (ref 135–145)

## 2017-09-26 LAB — HEPATIC FUNCTION PANEL
ALT: 25 U/L (ref 0–44)
AST: 26 U/L (ref 15–41)
Albumin: 3.6 g/dL (ref 3.5–5.0)
Alkaline Phosphatase: 58 U/L (ref 38–126)
BILIRUBIN DIRECT: 0.1 mg/dL (ref 0.0–0.2)
BILIRUBIN INDIRECT: 0.7 mg/dL (ref 0.3–0.9)
BILIRUBIN TOTAL: 0.8 mg/dL (ref 0.3–1.2)
Total Protein: 7.4 g/dL (ref 6.5–8.1)

## 2017-09-26 LAB — URINALYSIS, ROUTINE W REFLEX MICROSCOPIC
BILIRUBIN URINE: NEGATIVE
GLUCOSE, UA: NEGATIVE mg/dL
Ketones, ur: NEGATIVE mg/dL
NITRITE: NEGATIVE
PROTEIN: NEGATIVE mg/dL
Specific Gravity, Urine: 1.023 (ref 1.005–1.030)
pH: 6 (ref 5.0–8.0)

## 2017-09-26 LAB — CBC
HEMATOCRIT: 38.2 % (ref 36.0–46.0)
HEMOGLOBIN: 11.5 g/dL — AB (ref 12.0–15.0)
MCH: 23.2 pg — ABNORMAL LOW (ref 26.0–34.0)
MCHC: 30.1 g/dL (ref 30.0–36.0)
MCV: 77 fL — ABNORMAL LOW (ref 78.0–100.0)
Platelets: 318 10*3/uL (ref 150–400)
RBC: 4.96 MIL/uL (ref 3.87–5.11)
RDW: 13.9 % (ref 11.5–15.5)
WBC: 8.7 10*3/uL (ref 4.0–10.5)

## 2017-09-26 LAB — PREGNANCY, URINE: PREG TEST UR: NEGATIVE

## 2017-09-26 MED ORDER — PROCHLORPERAZINE EDISYLATE 10 MG/2ML IJ SOLN
10.0000 mg | Freq: Once | INTRAMUSCULAR | Status: AC
  Administered 2017-09-26: 10 mg via INTRAVENOUS
  Filled 2017-09-26: qty 2

## 2017-09-26 MED ORDER — CEPHALEXIN 500 MG PO CAPS: 500.0000 mg | ORAL_CAPSULE | Freq: Two times a day (BID) | ORAL | 0 refills | Status: AC

## 2017-09-26 MED ORDER — DIPHENHYDRAMINE HCL 12.5 MG/5ML PO ELIX
12.5000 mg | ORAL_SOLUTION | Freq: Once | ORAL | Status: AC
Start: 2017-09-26 — End: 2017-09-26
  Administered 2017-09-26: 12.5 mg via ORAL
  Filled 2017-09-26: qty 10

## 2017-09-26 NOTE — ED Notes (Signed)
Spoke with lab about adding on LFT

## 2017-09-26 NOTE — ED Notes (Signed)
Called lab, urine culture added.

## 2017-09-26 NOTE — Discharge Instructions (Addendum)
Thank you for allowing me to care for you today in the Emergency Department.   You can take 600 mg of ibuprofen once every 6 hours for a headache.  Please make sure to take this with food so that it does not upset your stomach.  Take 1 tablet of Keflex 2 times daily for the next 5 days.  Your urine has been sent to the lab for culture.  This can take a few days before the results are received.  Keflex should cover most bacteria that cause urinary tract infections.  This infection could be causing your tiredness and weakness.  You can call 1 of the 3 clinics above to get established with primary care.  They may be able to perform some additional blood work if you continue to feel tired with no energy.  Return to the emergency department if you develop new or worsening symptoms including persistent vomiting, high fever, double vision, loss of vision, if your blood pressure consistently stays elevated and you have a headache or chest pain, or other new, concerning symptoms.

## 2017-09-26 NOTE — ED Notes (Signed)
Pt alert and oriented in NAD. Pt verbalized understanding of discharge instructions. 

## 2017-09-26 NOTE — ED Notes (Signed)
Patient transported to CT 

## 2017-09-26 NOTE — ED Triage Notes (Addendum)
Patient complains of increased stress x 3 weeks with intermittent leg swelling and headaches. Stopped at fire station and they found her BP elevated, has hx of same and takes no meds. On arrival alert and oriented, NAD. No CP, no SOB

## 2017-09-26 NOTE — ED Provider Notes (Signed)
[facility] EMERGENCY DEPARTMENT Provider Note   CSN: [id number] Arrival date & time: 09/26/17  1025     History   Chief Complaint Chief Complaint  Patient presents with  . hypertension/increased stress    HPI Debbie Solis is a 32 y.o. female who presents to the emergency department with a chief complaint of elevated blood pressure.  She reports that she has been feeling generally unwell for the last 2 weeks so she went to check her blood pressure last night at [facility] and received a reading of 177/122.  Her symptoms did not improve this morning so she went to the fire station where she was found to have a blood pressure of 177/90 and was advised to come to the ED for further evaluation.  She reports an intermittent left sided headache that she characterizes as pressure that is located behind her eyes.  She reports an associated high-pitched tinnitus and bilateral otalgia.  She states that she feels as if her thoughts have been more cloudy intermittently over the last few weeks and she has been having some intermittent blurred vision.  She reports that she awoke this morning with some pain to her left upper shoulder and the back of the left side of her neck.  She denies neck stiffness.  She states that she has been under an increased amount of stress as she has been working a lot more over the last month.  She reports that she is also been very fatigued and has barely been eating or drinking because " I just do not have the energy."  She reports that she typically always makes her son snacks for school, but she has been so tired over the last few days that she is been having him make him for himself.  She also reports some intermittent abdominal discomfort that is worse after eating with intermittent nausea.  No emesis, diarrhea, or constipation.  LMP was 09/11/2017.  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   She was supposed to follow-up and have a LEEP procedure, which she has not scheduled yet.  The history is provided by the patient. No language interpreter was used.    History reviewed. No pertinent past medical history.  There are no active problems to display for this patient.   History reviewed. No pertinent surgical history.   OB History   None      Home Medications    Prior to Admission medications   Medication Sig Start Date End Date Taking? Authorizing Provider  ibuprofen (ADVIL,MOTRIN) 800 MG tablet Take 800 mg by mouth every 8 (eight) hours as needed for moderate pain.  05/23/17  Yes [provider]  Vitamin Mixture (ESTER-C PO) Take 1 tablet by mouth daily.   Yes [provider]  cephALEXin (KEFLEX) 500 MG capsule Take 1 capsule (500 mg total) by mouth 2 (two) times daily for 5 days. 09/26/17 10/01/17  Kellene Mccleary, Coral Else, PA-C    Family History Family History  Problem Relation Age of Onset  . Hypertension Mother   . Hypertension Father     Social History Social History   Tobacco Use  . Smoking status: Never Smoker  . Smokeless tobacco: Never Used  Substance Use Topics  . Alcohol use: No  . Drug use: No     Allergies   Alka-seltzer heartburn [sodium bicarbonate-citric acid]; Latex; and Ultrasound gel   Review of Systems  Review of Systems  Constitutional: Positive for fatigue. Negative for activity change, chills and fever.  HENT: Positive for tinnitus. Negative for congestion, sinus pressure and sinus pain.   Eyes: Positive for visual disturbance (blurred vision). Negative for photophobia.  Respiratory: Negative for cough and shortness of breath.   Cardiovascular: Positive for palpitations. Negative for chest pain and leg swelling.  Gastrointestinal: Positive for abdominal pain and nausea. Negative for vomiting.  Genitourinary: Positive for vaginal discharge. Negative for dysuria, frequency and vaginal pain.  Musculoskeletal: Positive  for myalgias and neck pain. Negative for arthralgias, back pain, gait problem and neck stiffness.  Skin: Negative for rash.  Allergic/Immunologic: Negative for immunocompromised state.  Neurological: Positive for headaches. Negative for dizziness, syncope, speech difficulty, weakness and numbness.  Psychiatric/Behavioral: Negative for confusion.   Physical Exam Updated Vital Signs BP 140/85   Pulse 73   Temp 98.3 F (36.8 C) (Oral)   Resp 16   Ht 5\' 4"  (1.626 m)   Wt 102.1 kg   SpO2 100%   BMI 38.62 kg/m   Physical Exam  Constitutional: No distress.  HENT:  Head: Normocephalic.  Right Ear: External ear normal.  Left Ear: External ear normal.  Eyes: Pupils are equal, round, and reactive to light. Conjunctivae and EOM are normal.  Neck: Neck supple.  Full active and passive range of motion of the neck.  Increased left trapezius pain with rotation of the head to the right.  No meningismus.  Cardiovascular: Normal rate, regular rhythm, normal heart sounds and intact distal pulses. Exam reveals no gallop and no friction rub.  No murmur heard. Pulmonary/Chest: Effort normal and breath sounds normal. No stridor. No respiratory distress. She has no wheezes. She has no rales. She exhibits no tenderness.  Abdominal: Soft. She exhibits no distension.  Obese abdomen  Musculoskeletal:  Reproducible tenderness to palpation to the left trapezius.  Neurological: She is alert.  Alert and oriented x4.  GCS 15.  Moves all 4 extremities.  No slurred speech.  Cranial nerves II through XII are grossly intact.  5 out of 5 muscle strength of the bilateral upper and lower muscle groups.  Sensation is intact and equal throughout.  No clonus to the bilateral extremities.  Heel-to-shin and finger-to-nose are intact bilaterally.  No pronator drift.  Skin: Skin is warm. No rash noted. She is not diaphoretic.  Psychiatric: Her behavior is normal.  Nursing note and vitals reviewed.    ED Treatments /  Results  Labs (all labs ordered are listed, but only abnormal results are displayed) Labs Reviewed  CBC - Abnormal; Notable for the following components:      Result Value   Hemoglobin 11.5 (*)    MCV 77.0 (*)    MCH 23.2 (*)    All other components within normal limits  BASIC METABOLIC PANEL - Abnormal; Notable for the following components:   Glucose, Bld 112 (*)    All other components within normal limits  URINALYSIS, ROUTINE W REFLEX MICROSCOPIC - Abnormal; Notable for the following components:   APPearance HAZY (*)    Hgb urine dipstick SMALL (*)    Leukocytes, UA MODERATE (*)    Bacteria, UA RARE (*)    All other components within normal limits  URINE CULTURE  PREGNANCY, URINE  HEPATIC FUNCTION PANEL    EKG None  Radiology Ct Head Wo Contrast  Result Date: 09/26/2017 CLINICAL DATA:  Headaches EXAM: CT HEAD WITHOUT CONTRAST TECHNIQUE: Contiguous axial images were obtained from the base  of the skull through the vertex without intravenous contrast. COMPARISON:  None. FINDINGS: Brain: No evidence of acute infarction, hemorrhage, hydrocephalus, extra-axial collection or mass lesion/mass effect. Vascular: No hyperdense vessel or unexpected calcification. Skull: Normal. Negative for fracture or focal lesion. Sinuses/Orbits: No acute finding. Other: None. IMPRESSION: Normal head CT for age Electronically Signed   By: Alcide CleverMark  Lukens M.D.   On: 09/26/2017 13:55    Procedures Procedures (including critical care time)  Medications Ordered in ED Medications  prochlorperazine (COMPAZINE) injection 10 mg (10 mg Intravenous Given 09/26/17 1330)  diphenhydrAMINE (BENADRYL) 12.5 MG/5ML elixir 12.5 mg (12.5 mg Oral Given 09/26/17 1328)     Initial Impression / Assessment and Plan / ED Course  I have reviewed the triage vital signs and the nursing notes.  Pertinent labs & imaging results that were available during my care of the patient were reviewed by me and considered in my medical  decision making (see chart for details).     32 year old female with no pertinent past medical history presented to the emergency department with a chief complaint of elevated blood pressure with headache, generalized fatigue, nausea, and tinnitus.  Given headache and elevated blood pressure, CT head was ordered, which was negative.  The patient's medical record was reviewed.  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  A LEEP procedure was recommended, but the patient has not had a procedure performed yet.  This could be the source of the patient's fatigue.  Compazine and Benadryl given in the ED for the patient's headache.  Asked by nursing staff to see the patient after she was given IV Compazine and oral Benadryl as the patient was driving and she did not want to be too drowsy.  She said she was " feeling weird" after the 2 medications, which I suspect is secondary to p.o. Benadryl with the IV Compazine.  No signs or symptoms of allergic reaction or anaphylaxis.  The patient was observed for the next hour with improving symptoms.  Pregnancy test was negative.  UA with leukocyte esterase and mild hemoglobinuria.  Urine culture sent and we will treat with Keflex she did mention she was having some abdominal discomfort.  I have also provided the patient was several referrals to get established with primary care as she may need some additional work-up if her fatigue and generalized weakness do not improve with treatment of UTI.  Strict return precautions given.  The patient is hemodynamically stable and in no acute distress.  Doubt metastatic lesion, hypertensive urgency, angitis, CVA, ICH, or SAH.  She is safe for discharge home with outpatient follow-up at this time.  Final Clinical Impressions(s) / ED Diagnoses   Final diagnoses:  Bad headache  Acute cystitis with hematuria    ED Discharge Orders         Ordered    cephALEXin (KEFLEX) 500 MG capsule  2 times  daily     09/26/17 1557           Darnita Woodrum A, PA-C 09/26/17 1607    Tegeler, Canary Brimhristopher J, MD 09/27/17 [phone]

## 2017-09-26 NOTE — ED Notes (Signed)
Called Mia to report pt feeling dizzy and not grounded after medications.

## 2017-09-27 LAB — URINE CULTURE: SPECIAL REQUESTS: NORMAL

## 2019-04-21 ENCOUNTER — Encounter: Payer: Self-pay | Admitting: Pulmonary Disease

## 2019-04-21 ENCOUNTER — Ambulatory Visit (INDEPENDENT_AMBULATORY_CARE_PROVIDER_SITE_OTHER): Payer: Medicaid Other | Admitting: Pulmonary Disease

## 2019-04-21 ENCOUNTER — Other Ambulatory Visit: Payer: Self-pay

## 2019-04-21 VITALS — BP 126/72 | HR 85 | Temp 97.5°F | Ht 64.0 in | Wt 229.4 lb

## 2019-04-21 DIAGNOSIS — R0602 Shortness of breath: Secondary | ICD-10-CM

## 2019-04-21 MED ORDER — ALBUTEROL SULFATE HFA 108 (90 BASE) MCG/ACT IN AERS: 2.0000 | INHALATION_SPRAY | Freq: Four times a day (QID) | RESPIRATORY_TRACT | 3 refills | Status: DC | PRN

## 2019-04-21 NOTE — Patient Instructions (Addendum)
Albuterol to be used as needed  Monitor symptoms closely  Pace yourself well  I do not believe we need to do any further investigation at the present time  If any worsening of symptoms or you are requiring albuterol on a daily basis we may want to consider escalation to a steroid inhaler  I will see you in 4 weeks

## 2019-04-21 NOTE — Progress Notes (Signed)
Subjective:    Patient ID: Debbie Solis, female    DOB: Apr 30, 1985, 34 y.o.   MRN: [id number]  Patient being seen for shortness of breath on exertion  She is [redacted] weeks pregnant Noticed increased shortness of breath in the last 4 to 5 weeks  Will get short of breath going up steps Sometimes just on level ground as well  She did have similar events during her last pregnancy, resolved postdelivery Event started later in the pregnancy compared to the current symptoms  Has no underlying lung disease Was never diagnosed with asthma  Albuterol did help symptoms during the last pregnancy  No significant leg swelling Denies any wheezing Denies any cough or chest pain or discomfort  History reviewed. No pertinent past medical history. Social History   Socioeconomic History  . Marital status: Single    Spouse name: Not on file  . Number of children: Not on file  . Years of education: Not on file  . Highest education level: Not on file  Occupational History  . Not on file  Tobacco Use  . Smoking status: Never Smoker  . Smokeless tobacco: Never Used  Substance and Sexual Activity  . Alcohol use: No  . Drug use: No  . Sexual activity: Yes  Other Topics Concern  . Not on file  Social History Narrative  . Not on file   Social Determinants of Health   Financial Resource Strain:   . Difficulty of Paying Living Expenses:   Food Insecurity:   . Worried About Programme researcher, broadcasting/film/video in the Last Year:   . Barista in the Last Year:   Transportation Needs:   . Freight forwarder (Medical):   Marland Kitchen Lack of Transportation (Non-Medical):   Physical Activity:   . Days of Exercise per Week:   . Minutes of Exercise per Session:   Stress:   . Feeling of Stress :   Social Connections:   . Frequency of Communication with Friends and Family:   . Frequency of Social Gatherings with Friends and Family:   . Attends Religious Services:   . Active Member of Clubs or Organizations:    . Attends Banker Meetings:   Marland Kitchen Marital Status:   Intimate Partner Violence:   . Fear of Current or Ex-Partner:   . Emotionally Abused:   Marland Kitchen Physically Abused:   . Sexually Abused:    Family History  Problem Relation Age of Onset  . Hypertension Mother   . Hypertension Father    Review of Systems  Constitutional: Negative for fever and unexpected weight change.  HENT: Negative for congestion, dental problem, ear pain, nosebleeds, postnasal drip, rhinorrhea, sinus pressure, sneezing, sore throat and trouble swallowing.   Eyes: Negative for redness and itching.  Respiratory: Positive for chest tightness and shortness of breath. Negative for cough and wheezing.   Cardiovascular: Negative for palpitations and leg swelling.  Gastrointestinal: Positive for nausea. Negative for vomiting.  Genitourinary: Negative for dysuria.  Musculoskeletal: Negative for joint swelling.  Skin: Negative for rash.  Allergic/Immunologic: Negative.  Negative for environmental allergies, food allergies and immunocompromised state.  Neurological: Positive for headaches.  Hematological: Does not bruise/bleed easily.  Psychiatric/Behavioral: Negative for dysphoric mood. The patient is not nervous/anxious.       Objective:     Vitals:   04/21/19 1022  BP: 126/72  Pulse: 85  Temp: (!) 97.5 F (36.4 C)  SpO2: 98%   Appearance - well kempt  ENMT -  nasal mucosa moist Neck - no masses, trachea midline Respiratory - normal appearance of chest wall, normal respiratory effort w/o accessory muscle use, no dullness on percussion, no tactile fremitus, no wheezing or rales CV - s1s2 regular rate and rhythm     Assessment & Plan:  .  Shortness of breath .  Early pregnancy  .  Albuterol did help symptoms in the past  .  No underlying history of lung disease  .  Plan: Marland Kitchen  Albuterol as needed .  We will see patient again in about 4 weeks .  Encouraged to call with any significant concerns .  If  symptoms not well controlled with albuterol then will consider a steroid inhaler .  May have underlying asthma   .

## 2019-06-13 IMAGING — DX DG ANKLE COMPLETE 3+V*L*
4 series · 4 of 4 positions shown · non-contrast
Comparison: None.

CLINICAL DATA: Inversion injury, lateral malleolar pain

EXAM:
LEFT ANKLE COMPLETE - 3+ VIEW

[ankle ap]
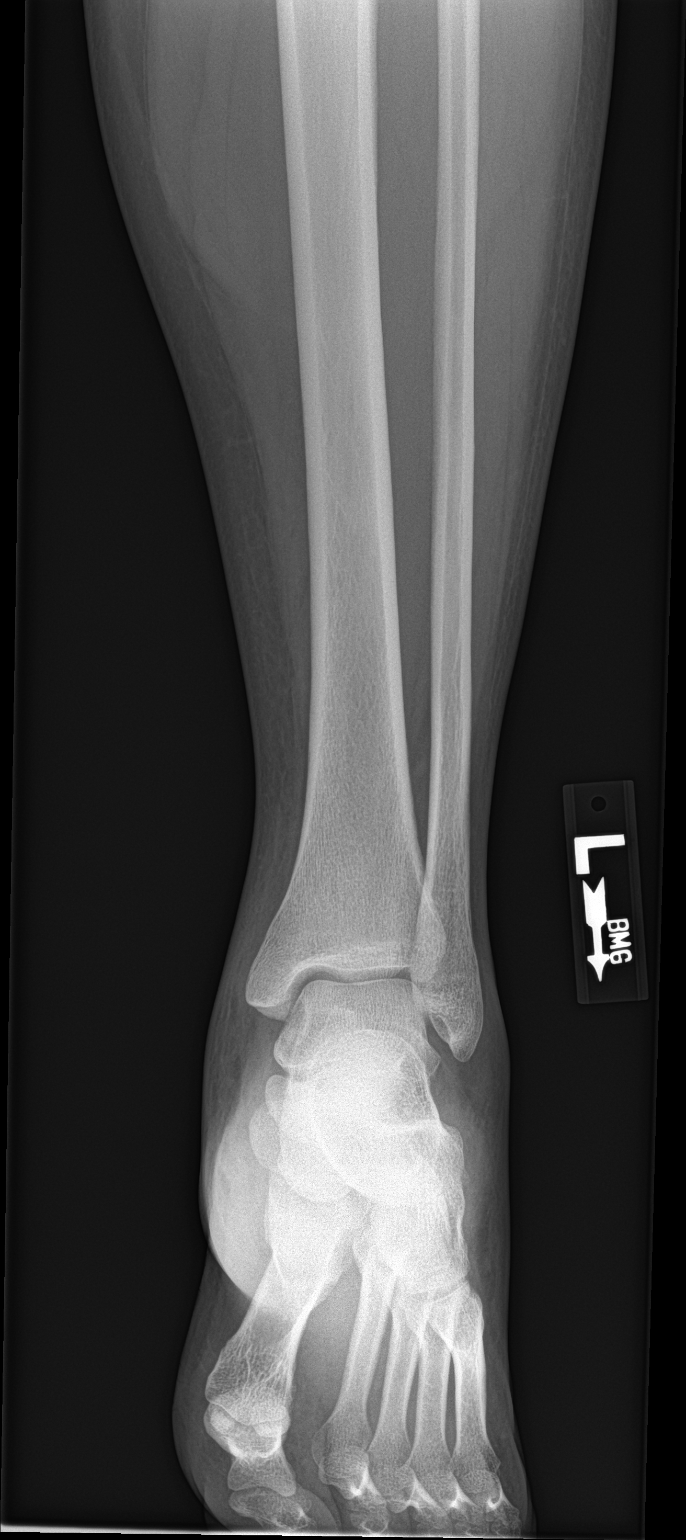

[ankle obl (1 of 2)]
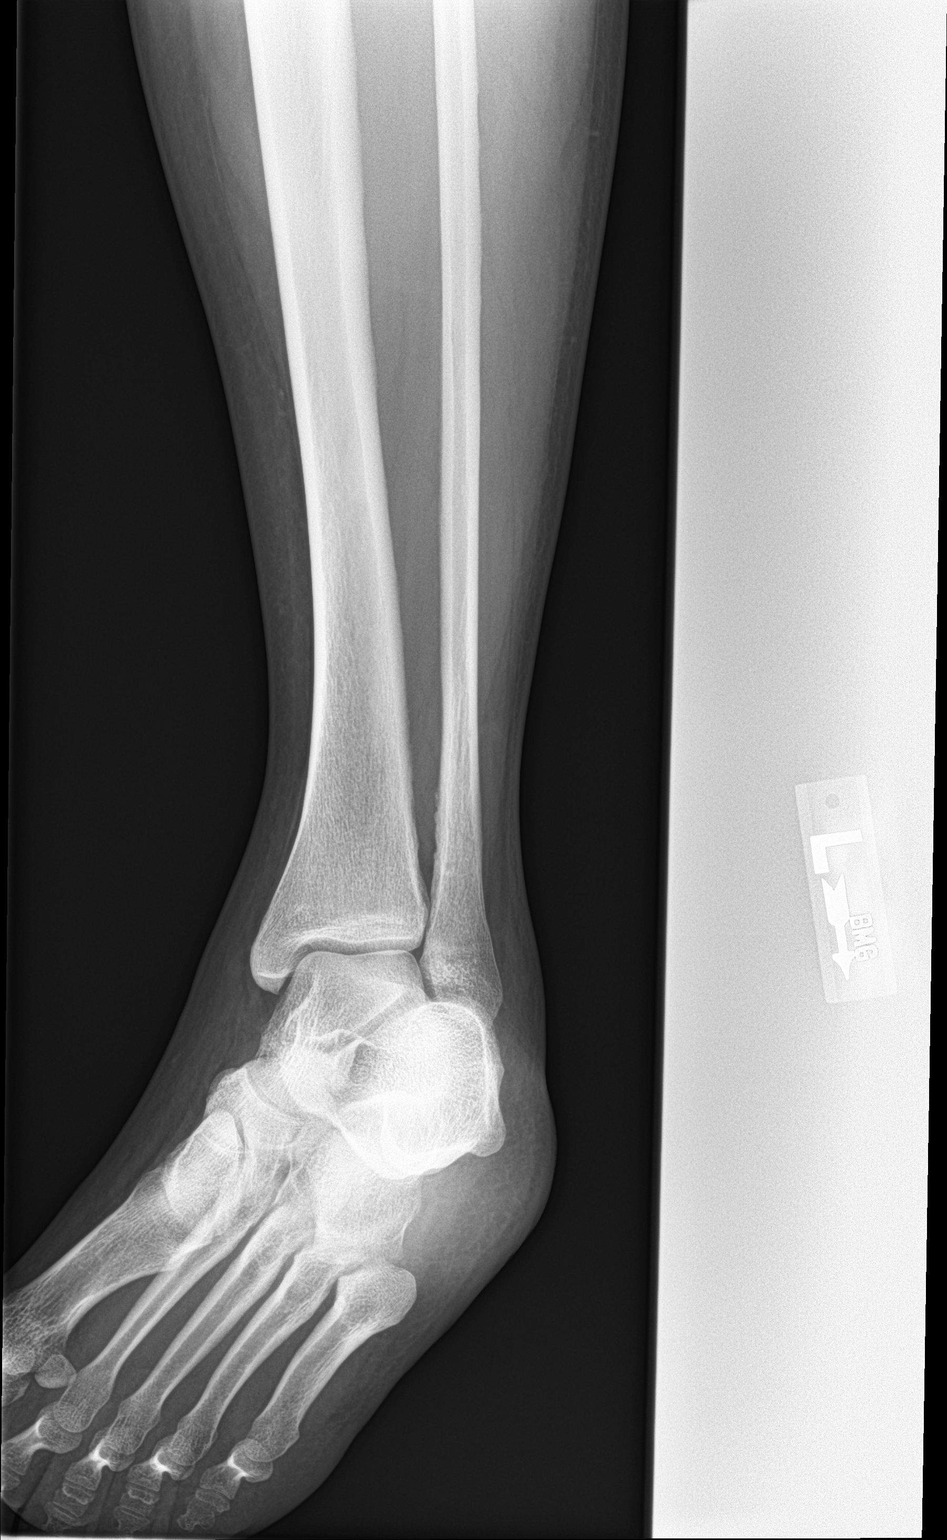

[ankle obl (2 of 2)]
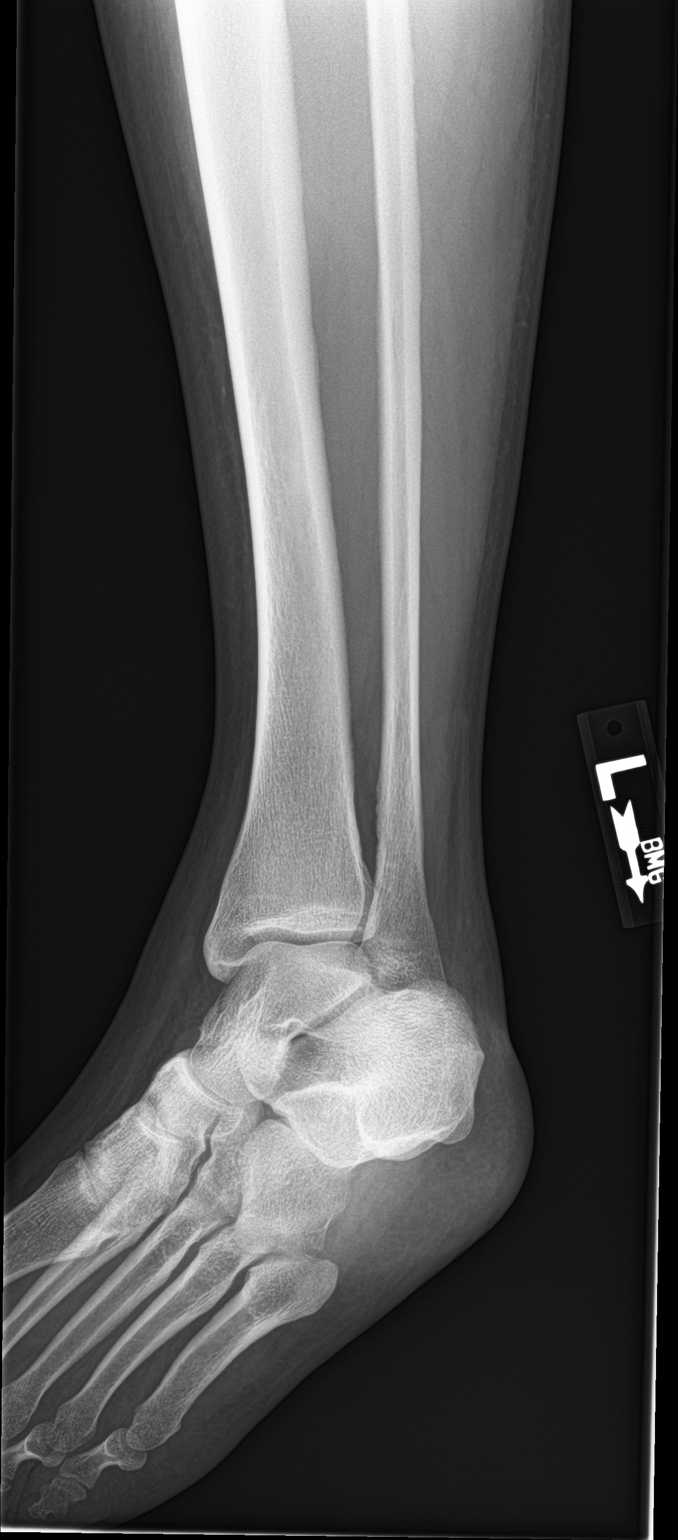

[ankle lat]
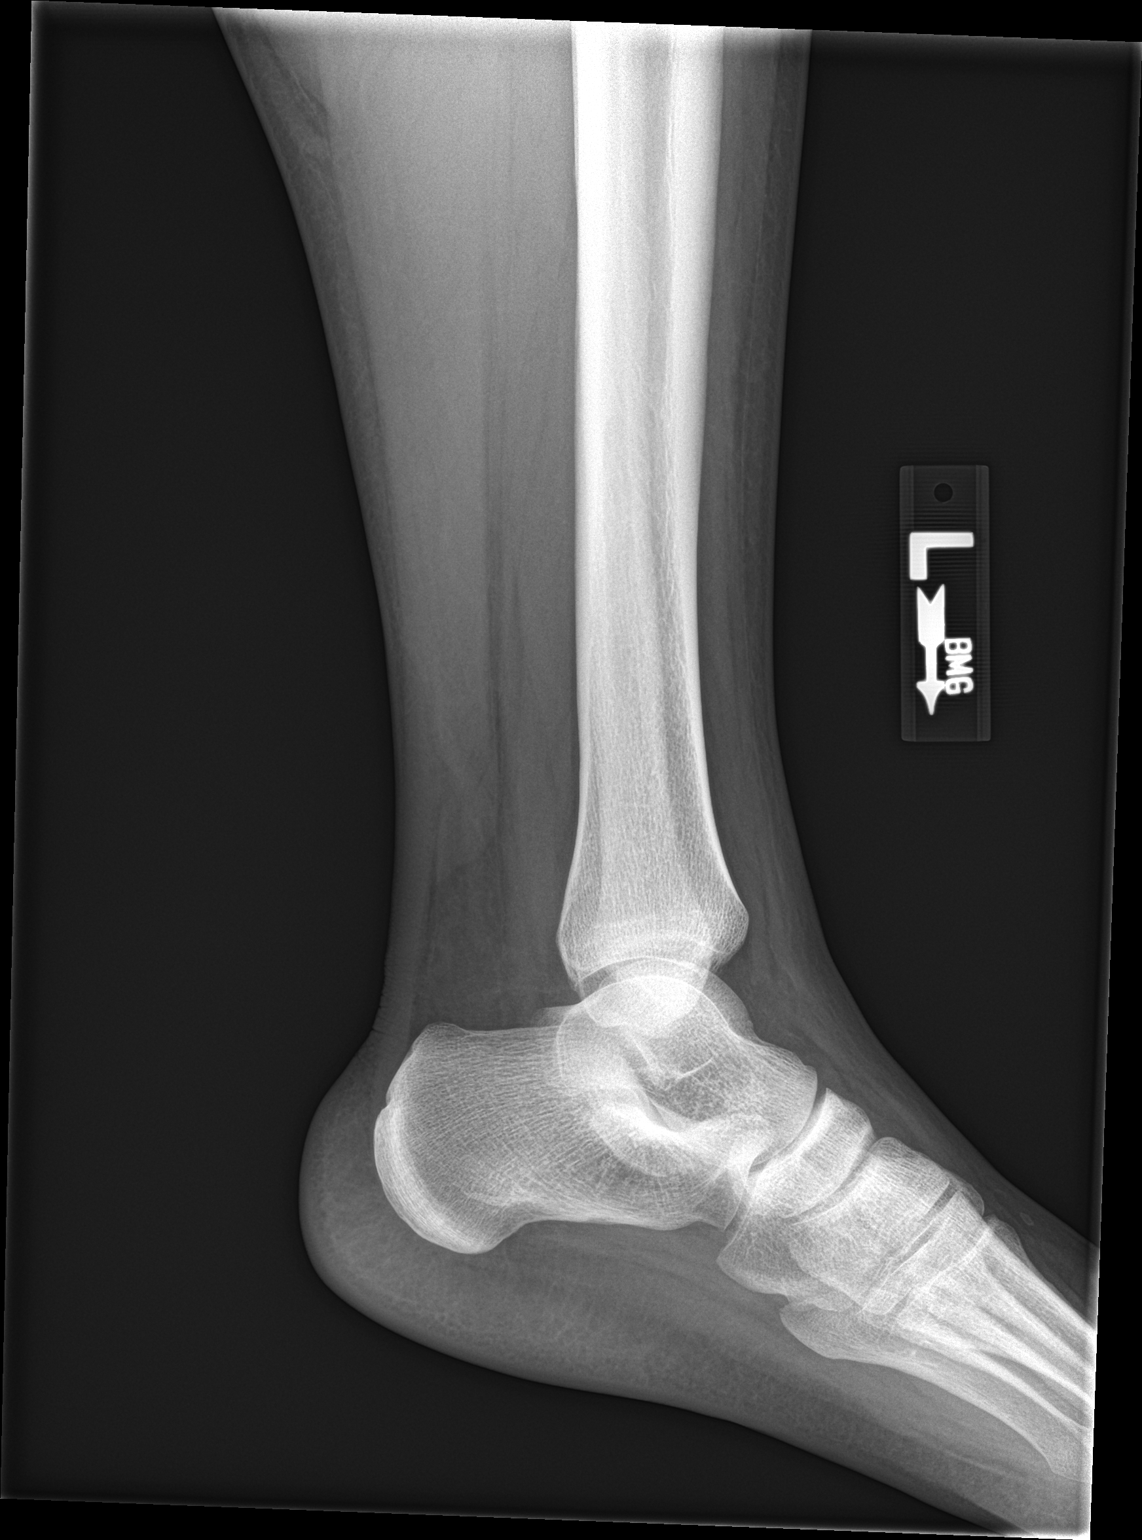

[4 of 4 positions shown; findings below may reference images not displayed]

FINDINGS: Scattered soft tissue swelling.

Osseous mineralization normal.

Joint spaces preserved.

No acute fracture, dislocation, or bone destruction.
IMPRESSION: Normal exam.

## 2019-06-24 ENCOUNTER — Ambulatory Visit: Payer: Medicaid Other | Admitting: Pulmonary Disease

## 2019-06-24 ENCOUNTER — Other Ambulatory Visit: Payer: Self-pay

## 2019-06-24 ENCOUNTER — Encounter: Payer: Self-pay | Admitting: Pulmonary Disease

## 2019-06-24 VITALS — BP 128/68 | HR 113 | Temp 97.9°F | Ht 64.0 in | Wt 243.0 lb

## 2019-06-24 DIAGNOSIS — R0602 Shortness of breath: Secondary | ICD-10-CM

## 2019-06-24 MED ORDER — BUDESONIDE 90 MCG/ACT IN AEPB: 2.0000 | INHALATION_SPRAY | Freq: Two times a day (BID) | RESPIRATORY_TRACT | 3 refills | Status: DC

## 2019-06-24 NOTE — Progress Notes (Signed)
Subjective:    Patient ID: Debbie Solis, female    DOB: Feb 07, 1985, 34 y.o.   MRN: [id number]  Patient being seen for shortness of breath on exertion  She is [redacted] weeks pregnant Increase shortness of breath  Albuterol does not seem to be helping Occasional wheezes  Activity level as become more challenging She has gained a good amount of weight with the pregnancy so far  She did have similar events during her last pregnancy, resolved postdelivery Event started later in the pregnancy compared to the current symptoms  Has no underlying lung disease Was never diagnosed with asthma  Albuterol did help symptoms during the last pregnancy  No significant leg swelling Denies any wheezing Denies any cough or chest pain or discomfort  History reviewed. No pertinent past medical history. Social History   Socioeconomic History  . Marital status: Single    Spouse name: Not on file  . Number of children: Not on file  . Years of education: Not on file  . Highest education level: Not on file  Occupational History  . Not on file  Tobacco Use  . Smoking status: Never Smoker  . Smokeless tobacco: Never Used  Substance and Sexual Activity  . Alcohol use: No  . Drug use: No  . Sexual activity: Yes  Other Topics Concern  . Not on file  Social History Narrative  . Not on file   Social Determinants of Health   Financial Resource Strain:   . Difficulty of Paying Living Expenses:   Food Insecurity:   . Worried About Programme researcher, broadcasting/film/video in the Last Year:   . Barista in the Last Year:   Transportation Needs:   . Freight forwarder (Medical):   Marland Kitchen Lack of Transportation (Non-Medical):   Physical Activity:   . Days of Exercise per Week:   . Minutes of Exercise per Session:   Stress:   . Feeling of Stress :   Social Connections:   . Frequency of Communication with Friends and Family:   . Frequency of Social Gatherings with Friends and Family:   . Attends Religious  Services:   . Active Member of Clubs or Organizations:   . Attends Banker Meetings:   Marland Kitchen Marital Status:   Intimate Partner Violence:   . Fear of Current or Ex-Partner:   . Emotionally Abused:   Marland Kitchen Physically Abused:   . Sexually Abused:    Family History  Problem Relation Age of Onset  . Hypertension Mother   . Hypertension Father    Review of Systems  Constitutional: Negative for fever and unexpected weight change.  HENT: Negative for congestion, dental problem, ear pain, nosebleeds, postnasal drip, rhinorrhea, sinus pressure, sneezing, sore throat and trouble swallowing.   Eyes: Negative for redness and itching.  Respiratory: Positive for chest tightness, shortness of breath and wheezing. Negative for cough.   Cardiovascular: Negative for palpitations and leg swelling.  Gastrointestinal: Negative for nausea and vomiting.  Genitourinary: Negative for dysuria.  Skin: Negative for rash.  Allergic/Immunologic: Negative.  Negative for environmental allergies.  Neurological: Positive for headaches.      Objective:     Vitals:   06/24/19 1032  BP: 128/68  Pulse: (!) 113  Temp: 97.9 F (36.6 C)  SpO2: 100%   Appearance - well kempt  ENMT - nasal mucosa moist Neck - no masses, trachea midline Respiratory -no wheezes, no crepitations, clear breath sounds CV - s1s2 regular rate and rhythm  Assessment & Plan:  .  Shortness of breath .  Midpregnancy  .  Albuterol did help symptoms in the past, not helping as much this time around   .  No underlying history of lung disease  .  Plan: .  Continue albuterol as needed .  I will follow-up in about 4 weeks .  I will add Pulmicort 90, 2 puffs twice daily  .  Call with significant concerns .  May have underlying asthma   .  Follow-up in 4 weeks

## 2019-06-24 NOTE — Patient Instructions (Addendum)
Shortness of breath on exertion Likely related to asthma  Add Pulmicort inhaler 90, 2 puffs twice daily Continue using albuterol as needed  I will see you again in about 4 weeks Call with significant concerns

## 2019-06-30 ENCOUNTER — Telehealth: Payer: Self-pay | Admitting: Pulmonary Disease

## 2019-06-30 NOTE — Telephone Encounter (Signed)
Called pt's pharmacy to see if pt has any insurance on file. Spoke with Morrie Sheldon at pharmacy who states that pt has [facility]. Pt's medicaid #: [id number] Q.  For pt's Pulmicort inhaler that was prescribed, Medicaid requires a PA to be done.  Checked formulary list and it shows that preferred are: Flovent HFA Pulmicort neb sol Dulera Spiriva Handihaler Advair Diskus (requires PA) Symbicort (requires PA)  Dr. Val Eagle, please advise if you want to switch pt to a different inhaler.

## 2019-07-01 ENCOUNTER — Other Ambulatory Visit: Payer: Self-pay | Admitting: Pulmonary Disease

## 2019-07-01 MED ORDER — FLUTICASONE PROPIONATE HFA 110 MCG/ACT IN AERO
2.0000 | INHALATION_SPRAY | Freq: Two times a day (BID) | RESPIRATORY_TRACT | 6 refills | Status: DC
Start: 2019-07-01 — End: 2019-07-21

## 2019-07-01 NOTE — Telephone Encounter (Signed)
Flovent sent in 

## 2019-07-01 NOTE — Telephone Encounter (Signed)
Called and spoke with pt who stated that she was told by the pharmacy that the flovent that was sent in in place of the pulmicort also is requiring a PA.  Rachael, is there any way you can check to see if there is an inhaler that does not require a PA. Also, pt is currently pregnant so we need to know which ones will be safe for her to use during pregnancy.

## 2019-07-01 NOTE — Telephone Encounter (Signed)
Patient states Flovent needs prior authorization with insurance company. Insurance is Medicaid. Pharmacy is [facility] [place]. Patient phone number is [phone].

## 2019-07-01 NOTE — Progress Notes (Unsigned)
flovent sent in

## 2019-07-02 NOTE — Telephone Encounter (Signed)
Adding Producer, television/film/video, as I do not know about what inhalers are ok to use during pregnancy.

## 2019-07-02 NOTE — Telephone Encounter (Signed)
I just ran a test claim for Flovent through Medicaid and it paid with zero copay. Called [facility] and pharmacist was able to process for zero copay. Called patient and advised. Nothing further is needed.

## 2019-07-21 ENCOUNTER — Other Ambulatory Visit: Payer: Self-pay

## 2019-07-21 ENCOUNTER — Ambulatory Visit: Payer: Medicaid Other | Admitting: Pulmonary Disease

## 2019-07-21 ENCOUNTER — Encounter: Payer: Self-pay | Admitting: Pulmonary Disease

## 2019-07-21 VITALS — BP 110/64 | HR 97 | Temp 98.4°F | Ht 64.0 in | Wt 248.4 lb

## 2019-07-21 DIAGNOSIS — R0602 Shortness of breath: Secondary | ICD-10-CM | POA: Diagnosis not present

## 2019-07-21 MED ORDER — FLUTICASONE PROPIONATE HFA 110 MCG/ACT IN AERO: 2.0000 | INHALATION_SPRAY | Freq: Two times a day (BID) | RESPIRATORY_TRACT | 6 refills | Status: DC

## 2019-07-21 NOTE — Progress Notes (Signed)
Subjective:    Patient ID: Debbie Solis, female    DOB: 10-Dec-1985, 34 y.o.   MRN: [id number]  Patient being seen for shortness of breath on exertion  She is [redacted] weeks pregnant We had added Flovent during the last visit This is helping greatly Activity level is improved Less short of breath Not really wheezing much Rarely needs albuterol  She has gained a good amount of weight with the pregnancy so far  She did have similar events during her last pregnancy, resolved postdelivery Event started later in the pregnancy compared to the current symptoms  Has no underlying lung disease Was never diagnosed with asthma  Albuterol did help symptoms during the last pregnancy  No significant leg swelling Denies any wheezing Denies any cough or chest pain or discomfort  History reviewed. No pertinent past medical history. Social History   Socioeconomic History  . Marital status: Single    Spouse name: Not on file  . Number of children: Not on file  . Years of education: Not on file  . Highest education level: Not on file  Occupational History  . Not on file  Tobacco Use  . Smoking status: Never Smoker  . Smokeless tobacco: Never Used  Substance and Sexual Activity  . Alcohol use: No  . Drug use: No  . Sexual activity: Yes  Other Topics Concern  . Not on file  Social History Narrative  . Not on file   Social Determinants of Health   Financial Resource Strain:   . Difficulty of Paying Living Expenses:   Food Insecurity:   . Worried About Programme researcher, broadcasting/film/video in the Last Year:   . Barista in the Last Year:   Transportation Needs:   . Freight forwarder (Medical):   Marland Kitchen Lack of Transportation (Non-Medical):   Physical Activity:   . Days of Exercise per Week:   . Minutes of Exercise per Session:   Stress:   . Feeling of Stress :   Social Connections:   . Frequency of Communication with Friends and Family:   . Frequency of Social Gatherings with Friends and  Family:   . Attends Religious Services:   . Active Member of Clubs or Organizations:   . Attends Banker Meetings:   Marland Kitchen Marital Status:   Intimate Partner Violence:   . Fear of Current or Ex-Partner:   . Emotionally Abused:   Marland Kitchen Physically Abused:   . Sexually Abused:    Family History  Problem Relation Age of Onset  . Hypertension Mother   . Hypertension Father    Review of Systems  Constitutional: Negative for fever and unexpected weight change.  HENT: Negative for congestion, dental problem, ear pain, nosebleeds, postnasal drip, rhinorrhea, sinus pressure, sneezing, sore throat and trouble swallowing.   Eyes: Negative for redness and itching.  Respiratory: Positive for shortness of breath. Negative for cough, chest tightness and wheezing.   Cardiovascular: Negative for palpitations and leg swelling.  Gastrointestinal: Negative for nausea and vomiting.  Genitourinary: Negative for dysuria.  Skin: Negative for rash.  Allergic/Immunologic: Negative.  Negative for environmental allergies.  Neurological: Negative for headaches.      Objective:     Vitals:   07/21/19 1149  BP: 110/64  Pulse: 97  Temp: 98.4 F (36.9 C)  SpO2: 98%   Appearance - well kempt  ENMT - nasal mucosa moist Neck - no masses, trachea midline Respiratory -no wheezes, no crepitations, clear breath sounds  CV - s1s2 regular rate and rhythm     Assessment & Plan:  .  Shortness of breath .  Midpregnancy -Doing much better with Flovent -Albuterol use is decreased -Activity tolerance improved    .  No underlying history of lung disease  .  Plan: .  Continue albuterol as needed .  Continue Flovent.  .  Call with significant concerns   .  Follow-up in 6 weeks

## 2019-07-21 NOTE — Patient Instructions (Signed)
Asthma in pregnancy  Continue Flovent Albuterol as needed  I will follow-up with you in 6 weeks Call with concerns

## 2019-09-01 ENCOUNTER — Other Ambulatory Visit: Payer: Self-pay

## 2019-09-01 ENCOUNTER — Ambulatory Visit (INDEPENDENT_AMBULATORY_CARE_PROVIDER_SITE_OTHER): Payer: Medicaid Other | Admitting: Pulmonary Disease

## 2019-09-01 ENCOUNTER — Encounter: Payer: Self-pay | Admitting: Pulmonary Disease

## 2019-09-01 VITALS — BP 132/72 | HR 76 | Temp 98.4°F | Ht 64.0 in | Wt 250.0 lb

## 2019-09-01 DIAGNOSIS — R0602 Shortness of breath: Secondary | ICD-10-CM

## 2019-09-01 DIAGNOSIS — J4520 Mild intermittent asthma, uncomplicated: Secondary | ICD-10-CM | POA: Diagnosis not present

## 2019-09-01 NOTE — Patient Instructions (Signed)
Asthma -Well-controlled with current Flovent and albuterol as needed  Continue current treatment No changes being made today  Following delivery you may try to stop Flovent and see how your symptoms are Reinitiate Flovent if symptoms do not remain well controlled after medication  I will see you again in about 3 months   Call with any significant concerns

## 2019-09-01 NOTE — Progress Notes (Signed)
Subjective:    Patient ID: Debbie Solis, female    DOB: 1985-09-19, 34 y.o.   MRN: [id number]  Patient being seen for shortness of breath on exertion  She is [redacted] weeks pregnant She continues to do very well with Flovent Flovent continues to be helped greatly  She is not restricting her salt respect to activities Rarely uses albuterol maybe once or twice a week at most  She has gained a good amount of weight with the pregnancy so far  She did have similar events during her last pregnancy, resolved postdelivery Event started later in the pregnancy compared to the current symptoms  Has no underlying lung disease Was never diagnosed with asthma  Albuterol did help symptoms during the last pregnancy  No significant leg swelling Denies any wheezing Denies any cough or chest pain or discomfort  No past medical history on file. Social History   Socioeconomic History  . Marital status: Single    Spouse name: Not on file  . Number of children: Not on file  . Years of education: Not on file  . Highest education level: Not on file  Occupational History  . Not on file  Tobacco Use  . Smoking status: Never Smoker  . Smokeless tobacco: Never Used  Substance and Sexual Activity  . Alcohol use: No  . Drug use: No  . Sexual activity: Yes  Other Topics Concern  . Not on file  Social History Narrative  . Not on file   Social Determinants of Health   Financial Resource Strain:   . Difficulty of Paying Living Expenses:   Food Insecurity:   . Worried About Programme researcher, broadcasting/film/video in the Last Year:   . Barista in the Last Year:   Transportation Needs:   . Freight forwarder (Medical):   Marland Kitchen Lack of Transportation (Non-Medical):   Physical Activity:   . Days of Exercise per Week:   . Minutes of Exercise per Session:   Stress:   . Feeling of Stress :   Social Connections:   . Frequency of Communication with Friends and Family:   . Frequency of Social Gatherings with  Friends and Family:   . Attends Religious Services:   . Active Member of Clubs or Organizations:   . Attends Banker Meetings:   Marland Kitchen Marital Status:   Intimate Partner Violence:   . Fear of Current or Ex-Partner:   . Emotionally Abused:   Marland Kitchen Physically Abused:   . Sexually Abused:    Family History  Problem Relation Age of Onset  . Hypertension Mother   . Hypertension Father    Review of Systems  Constitutional: Negative for fever and unexpected weight change.  HENT: Negative for congestion, dental problem, ear pain, nosebleeds, postnasal drip, rhinorrhea, sinus pressure, sneezing, sore throat and trouble swallowing.   Eyes: Negative for redness and itching.  Respiratory: Negative for cough, chest tightness, shortness of breath and wheezing.   Cardiovascular: Negative for palpitations and leg swelling.  Gastrointestinal: Negative for nausea and vomiting.  Skin: Negative for rash.     Change Objective:     Vitals:   09/01/19 1204  BP: (!) 132/72  Pulse: 76  Temp: 98.4 F (36.9 C)  SpO2: 97%   Appearance - well kempt  ENMT - nasal mucosa moist Neck - no masses, trachea midline Respiratory -no wheezes, no crepitations, clear breath sounds  CV - s1s2 regular rate and rhythm Gravid    Assessment &  Plan:  .  Shortness of breath .  Third trimester pregnancy -Doing much better with Flovent -Continues to feel benefit with Flovent -Albuterol use is decreased -Activity tolerance improved    .  No underlying history of lung disease  .  Plan: .  Continue albuterol as needed .  Continue Flovent-no changes to current inhaler  .  Call with significant concerns   .  Follow-up in 3 months  .  Encouraged to call with any significant concerns

## 2019-09-20 ENCOUNTER — Institutional Professional Consult (permissible substitution): Payer: Self-pay | Admitting: Pediatrics

## 2020-01-13 NOTE — Progress Notes (Signed)
[facility] New Patient Evaluation and Consultation  Referring Provider: No ref. provider found PCP: Debbie Providence, FNP Date of Service: 01/17/2020  SUBJECTIVE Chief Complaint: New Patient (Initial Visit) Debbie Solis referral)  History of Present Illness: Debbie Solis is a 34 y.o. Black or African-American female seen in consultation at the request of Debbie Solis for evaluation of pelvic floor dysfunction.    Review of records significant for: Having pelvic/ abdominal pain since the birth of her child.  Urinary Symptoms: Does not leak urine.   Day time voids 5.  Nocturia: 0 times per night to void. Voiding dysfunction: she empties her bladder well.  does not use a catheter to empty bladder.  When urinating, she feels to push on her vagina to empty bladder  UTIs: 0 UTI's in the last year.   Denies history of blood in urine and kidney or bladder stones  Pelvic Organ Prolapse Symptoms:                  She Denies a feeling of a bulge the vaginal area.   Bowel Symptom: Bowel movements: 1 time(s) per day Stool consistency: hard, soft  or loose Straining: no.  Splinting: no.  Incomplete evacuation: no.  She Denies accidental bowel leakage / fecal incontinence Bowel regimen: detox tea Last colonoscopy: n/a  Sexual Function Sexually active: yes.  Sexual orientation: Heterosexual Pain with sex: No  Pelvic Pain Admits to pelvic pain- started during pregnancy Location: internal pelvis Pain occurs: with movement Prior pain treatment: physical therapy [facility]), did only external exercises/ therapy Improved by: keeping knees together Worsened by: kicking   Past Medical History: History reviewed. No pertinent past medical history.   Past Surgical History:   Past Surgical History:  Procedure Laterality Date  . CESAREAN SECTION       Past OB/GYN History: G5 P3 Vaginal deliveries: 0,  Forceps/ Vacuum deliveries: 0, Cesarean section:  3 Menopausal: No, currently has bleeding.  Contraception: Had mirena put in during delivery, fell out last week. Last pap smear was 05/2019- negative.  Any history of abnormal pap smears: yes.   Medications: She has a current medication list which includes the following prescription(s): cyclobenzaprine, ibuprofen, losartan, albuterol, diazepam, and fluticasone.   Allergies: Patient is allergic to alka-seltzer heartburn [sodium bicarbonate-citric acid], latex, and ultrasound gel.   Social History:  Social History   Tobacco Use  . Smoking status: Never Smoker  . Smokeless tobacco: Never Used  Vaping Use  . Vaping Use: Never used  Substance Use Topics  . Alcohol use: No  . Drug use: No    Relationship status: long-term partner She lives with children and partner.   She is not employed. Regular exercise: No History of abuse: No  Family History:   Family History  Problem Relation Age of Onset  . Hypertension Mother   . Hypertension Father   . Cancer Maternal Aunt        breast     Review of Systems: Review of Systems  Constitutional: Negative for fever, malaise/fatigue and weight loss.  Respiratory: Negative for cough, shortness of breath and wheezing.   Cardiovascular: Negative for chest pain, palpitations and leg swelling.  Gastrointestinal: Negative for abdominal pain and blood in stool.  Genitourinary: Negative for dysuria.  Musculoskeletal: Negative for myalgias.  Skin: Negative for rash.  Neurological: Negative for dizziness and headaches.  Endo/Heme/Allergies: Does not bruise/bleed easily.  Psychiatric/Behavioral: Negative for depression. The patient is not nervous/anxious.  OBJECTIVE Physical Exam: Vitals:   01/17/20 0834  BP: (!) 161/94  Pulse: (!) 121  Weight: 234 lb 8 oz (106.4 kg)  Height: 5\' 4"  (1.626 m)    Physical Exam Constitutional:      General: She is not in acute distress. Pulmonary:     Effort: Pulmonary effort is normal.   Abdominal:     General: There is no distension.     Palpations: Abdomen is soft.     Tenderness: There is no abdominal tenderness. There is no rebound.  Musculoskeletal:        General: No swelling. Normal range of motion.  Skin:    General: Skin is warm and dry.     Findings: No rash.  Neurological:     Mental Status: She is alert and oriented to person, place, and time.  Psychiatric:        Mood and Affect: Mood normal.        Behavior: Behavior normal.     GU / Detailed Urogynecologic Evaluation:  Pelvic Exam: Normal external female genitalia; Bartholin's and Skene's glands normal in appearance; urethral meatus normal in appearance, no urethral masses or discharge.   CST: negative  Q tip test: + allodynia at introitus on right  Speculum exam reveals normal vaginal mucosa without atrophy. Cervix normal appearance and blood coming from os. Uterus normal single, nontender. Adnexa no mass, fullness, tenderness.     Pelvic floor strength III/V  Pelvic floor musculature: Right levator tender, Right obturator tender, Left levator non-tender, Left obturator tender  POP-Q:  Not done, no prolapse   Rectal Exam:  Normal external rectum  Post-Void Residual (PVR) by Bladder Scan: In order to evaluate bladder emptying, we discussed obtaining a postvoid residual and she agreed to this procedure.  Procedure: The ultrasound unit was placed on the patient's abdomen in the suprapubic region after the patient had voided. A PVR of 84 ml was obtained by bladder scan.  Laboratory Results: POC urine- +blood and trace protein (currently menstruating).   I visualized the urine specimen, noting the specimen to be red/ yellow  ASSESSMENT AND PLAN Debbie Solis is a 34 y.o. with:  1. Levator spasm   2. Urinary hesitancy    1. Levator spasm The origin of pelvic floor muscle spasm can be multifactorial, including primary, reactive to a different pain source, trauma, or even part of a  centralized pain syndrome.Treatment options include pelvic floor physical therapy, local (vaginal) or oral  muscle relaxants, or centrally acting pain medications.  - She would like to restart pelvic floor physical therapy. Referral placed to [facility].  - Also would like to start valium suppositories. Rx prescribed for 5mg  tabs. She is not currently breastfeeding. Instructed to place vaginally nightly for 2 weeks then as needed after. She also occasionally takes cyclobenzaprine and reviewed to not use these two medications together.  - She will begin to restart external pelvic floor exercises.   2. Urinary hesitancy - Reviewed that this can be a result of tight pelvic floor muscles and pelvic PT and valium can help. Discussed deep breathing and relaxation while on toilet to help relax muscles.  - POC urine negative for infection. +blood but currently menstruating.   Elevated BP- has been working with her PCP and changing medication  Can return if she does not have improvement with pelvic PT  36, MD   Medical Decision Making:  - Reviewed/ ordered a clinical laboratory test - Review and summation  of prior records - Independent review of urine specimen

## 2020-01-17 ENCOUNTER — Encounter: Payer: Self-pay | Admitting: Obstetrics and Gynecology

## 2020-01-17 ENCOUNTER — Ambulatory Visit (INDEPENDENT_AMBULATORY_CARE_PROVIDER_SITE_OTHER): Payer: Medicaid Other | Admitting: Obstetrics and Gynecology

## 2020-01-17 ENCOUNTER — Other Ambulatory Visit: Payer: Self-pay

## 2020-01-17 VITALS — BP 161/94 | HR 121 | Ht 64.0 in | Wt 234.5 lb

## 2020-01-17 DIAGNOSIS — M62838 Other muscle spasm: Secondary | ICD-10-CM | POA: Diagnosis not present

## 2020-01-17 DIAGNOSIS — R3911 Hesitancy of micturition: Secondary | ICD-10-CM | POA: Diagnosis not present

## 2020-01-17 LAB — POCT URINALYSIS DIPSTICK
Appearance: NORMAL
Bilirubin, UA: NEGATIVE
Glucose, UA: NEGATIVE
Ketones, UA: NEGATIVE
Leukocytes, UA: NEGATIVE
Nitrite, UA: NEGATIVE
Protein, UA: POSITIVE — AB
Spec Grav, UA: 1.025 (ref 1.010–1.025)
Urobilinogen, UA: NEGATIVE E.U./dL — AB
pH, UA: 6 (ref 5.0–8.0)

## 2020-01-17 MED ORDER — DIAZEPAM 5 MG PO TABS: ORAL_TABLET | ORAL | 0 refills | Status: DC

## 2020-01-17 NOTE — Patient Instructions (Signed)
I will prescribe valium 5 mg pills to place vaginally up to 2 times a day for vaginal muscle spasms. Start at night and take one every night for the next several weeks to see if it improves your symptoms. Once you are improving you can taper off the medication and just use as needed. If the medication makes you drowsy then only use at bedtime and/or we can reduce the dose. Do not use gel to place it, as that will prevent the tablet from dissolving.  Instead place 1-2 drops of water on the table before inserting it in the vagina. If the pills do not dissolve well we can switch to a special compounded suppository. Let me know how you are doing on the medication and if you have any questions.     

## 2020-05-02 ENCOUNTER — Ambulatory Visit: Payer: Medicaid Other | Admitting: Physical Therapy

## 2020-05-09 ENCOUNTER — Encounter: Payer: Medicaid Other | Admitting: Physical Therapy

## 2020-05-16 ENCOUNTER — Encounter: Payer: Medicaid Other | Admitting: Physical Therapy

## 2020-05-23 ENCOUNTER — Encounter: Payer: Medicaid Other | Admitting: Physical Therapy

## 2020-05-30 ENCOUNTER — Encounter: Payer: Medicaid Other | Admitting: Physical Therapy

## 2022-02-08 ENCOUNTER — Ambulatory Visit: Payer: BC Managed Care – PPO | Admitting: Internal Medicine

## 2022-02-08 ENCOUNTER — Other Ambulatory Visit: Payer: Self-pay

## 2022-02-08 ENCOUNTER — Encounter: Payer: Self-pay | Admitting: Internal Medicine

## 2022-02-08 VITALS — BP 138/100 | HR 80 | Temp 98.1°F | Resp 16 | Ht 66.0 in | Wt 231.1 lb

## 2022-02-08 DIAGNOSIS — L309 Dermatitis, unspecified: Secondary | ICD-10-CM | POA: Insufficient documentation

## 2022-02-08 DIAGNOSIS — J302 Other seasonal allergic rhinitis: Secondary | ICD-10-CM

## 2022-02-08 DIAGNOSIS — J3089 Other allergic rhinitis: Secondary | ICD-10-CM | POA: Diagnosis not present

## 2022-02-08 DIAGNOSIS — L501 Idiopathic urticaria: Secondary | ICD-10-CM | POA: Diagnosis not present

## 2022-02-08 DIAGNOSIS — H1013 Acute atopic conjunctivitis, bilateral: Secondary | ICD-10-CM | POA: Diagnosis not present

## 2022-02-08 DIAGNOSIS — J45909 Unspecified asthma, uncomplicated: Secondary | ICD-10-CM | POA: Insufficient documentation

## 2022-02-08 MED ORDER — FEXOFENADINE HCL 180 MG PO TABS: 180.0000 mg | ORAL_TABLET | Freq: Two times a day (BID) | ORAL | 5 refills | Status: AC | PRN

## 2022-02-08 MED ORDER — OLOPATADINE HCL 0.2 % OP SOLN: 1.0000 [drp] | Freq: Every day | OPHTHALMIC | 5 refills | Status: AC | PRN

## 2022-02-08 MED ORDER — FAMOTIDINE 20 MG PO TABS: 20.0000 mg | ORAL_TABLET | Freq: Two times a day (BID) | ORAL | 5 refills | Status: AC | PRN

## 2022-02-08 MED ORDER — FLUTICASONE PROPIONATE 50 MCG/ACT NA SUSP: 2.0000 | Freq: Every day | NASAL | 5 refills | Status: AC

## 2022-02-08 NOTE — Progress Notes (Signed)
NEW PATIENT  Date of Service/Encounter:  02/08/22  Consult requested by: Diamantina Providence, FNP   Subjective:   Debbie Solis (DOB: [birth year]) is a 37 y.o. female who presents to the clinic on 02/08/2022 with a chief complaint of Establish Care and Allergy Testing (Food allergy) .    History obtained from: chart review and patient.   Swelling/Hives: Reports on 10/28, she woke up with facial swelling, lots of itching and later some hives on cheeks.  She had eaten Egglplant parmesan for dinner the night prior and nothing that morning.  She took an Careers adviser and it improved.  Did not eat any red meat the day prior. Then she ate a pizza for lunch and took a nap and woke up later with recurrence of swelling and feeling of throat closing or soreness.  She therefore took benadryl and went to the ER.  ER note 10/28 indicates mild periorbital puffiness, clear posterior pharynx, no changes in voice/drooling, no edema or lips/tongue. Her BP was elevated.  She was given prednisone 40mg  and pepcid 20mg  in ER and watched and had improved; discharged home with hydroxyzine 25mg  PRN and prednisone 50mg  daily for 4 days. Then she continued to have intermittent facial swelling and hives despite the use of hydroxyzine and prednisone.  She also started noticing development of mouth sores and sore throat so she saw her PCP on 10/31.  PCP notes lip swelling on exam, maculopapular rash on lower chin, white coating on tongue, tonsillar erythema.  She was told to go to the ER.  So she went back to the ER on 10/31 and was started on another prednisone course 50mg  for 4 days, hydroxyzine PRN and added Pepcid.  She was also given Labetalol for elevated BP.  She also reports her PCP gave her a mouthwash that helped with the mouth pain and sore throat.  This episode of hives/swelling lasted for about 1 week.  She denies use of any new products, medications.  She does have a distant history of hives that were occurring from  high school through college and were random but she thought maybe it was related to eating a lot of citric acid foods.   Since this episode, no further episodes of swelling or hives. She is not taking any anti histamines since that episode in October.   Pregnancy induced Asthma: Reports no childhood history of asthma but did develop it in pregnancy in 2021 requiring use of  Flovent 11/31 2 puffs BID and  Albuterol PRN.  Since then, she has not had any trouble with prolonged coughing, wheezing, shortness of breath or required any inhalers.    Rhinitis:  Started about 1 year ago. Symptoms include: nasal congestion, sneezing, watery eyes, and itchy eyes Moved from [place] to [place] to an industrial area.   Occurs seasonally-Spring Potential triggers: not sure Treatments tried:  None  Previous allergy testing: no History of reflux/heartburn: no History of sinus surgery: no Nonallergic triggers: none   Past Medical History: Past Medical History:  Diagnosis Date   Asthma    during pregnancy only   Eczema    in ear channel (younger years)    Past Surgical History: Past Surgical History:  Procedure Laterality Date   CESAREAN SECTION      Family History: Family History  Problem Relation Age of Onset   Hypertension Mother    Eczema Father    Allergic rhinitis Father    Hypertension Father    Eczema Sister  Allergic rhinitis Sister    Cancer Maternal Aunt        breast    Social History:  Lives in a 11 year house Flooring in bedroom: carpet Pets: none Tobacco use/exposure: none Job: assessment and tech coordinator/faculty  Medication List:  Allergies as of 02/08/2022       Reactions   Latex Itching   Sodium Bicarbonate-citric Acid Swelling   Ultrasound Gel Itching, Rash        Medication List        Accurate as of February 08, 2022 12:22 PM. If you have any questions, ask your nurse or doctor.          STOP taking these medications     albuterol 108 (90 Base) MCG/ACT inhaler Commonly known as: VENTOLIN HFA Stopped by: Larose Kells, MD   cyclobenzaprine 5 MG tablet Commonly known as: FLEXERIL Stopped by: Larose Kells, MD   diazepam 5 MG tablet Commonly known as: VALIUM Stopped by: Larose Kells, MD   fluticasone 110 MCG/ACT inhaler Commonly known as: FLOVENT HFA Stopped by: Larose Kells, MD   ibuprofen 800 MG tablet Commonly known as: ADVIL Stopped by: Larose Kells, MD       TAKE these medications    losartan 50 MG tablet Commonly known as: COZAAR Take 50 mg by mouth daily.         REVIEW OF SYSTEMS: Pertinent positives and negatives discussed in HPI.   Objective:   Physical Exam: BP (!) 138/100   Pulse 80   Temp 98.1 F (36.7 C) (Temporal)   Resp 16   Ht 5\' 6"  (1.676 m)   Wt 231 lb 1.6 oz (104.8 kg)   SpO2 100%   BMI 37.30 kg/m  Body mass index is 37.3 kg/m. GEN: alert, well developed HEENT: clear conjunctiva, TM grey and translucent, nose with + inferior turbinate hypertrophy, pink nasal mucosa, slight clear rhinorrhea, no cobblestoning HEART: regular rate and rhythm, no murmur LUNGS: clear to auscultation bilaterally, no coughing, unlabored respiration ABDOMEN: soft, non distended  SKIN: no rashes or lesions  Reviewed:  ER visit 10/28 PCP visit 10/31 ER visit 10/31  Skin Testing:  Skin prick testing was placed, which includes aeroallergens/foods, histamine control, and saline control.  Verbal consent was obtained prior to placing test.  Patient tolerated procedure well.  Allergy testing results were read and interpreted by myself, documented by clinical staff. Adequate positive and negative control.  Results discussed with patient/family.  Airborne Adult Perc - 02/08/22 1108     Time Antigen Placed 1113    Allergen Manufacturer Lavella Hammock    Location Back    Number of Test 57    1. Control-Buffer 50% Glycerol Negative    2. Control-Histamine 1 mg/ml 3+    3. Albumin  saline Negative    4. Junction Negative    5. [place] Negative    6. Johnson Negative    7. [place] Blue Negative    10. Sweet Vernal Negative    11. Timothy Negative    12. Cocklebur Negative    13. Burweed Marshelder Negative    14. Ragweed, short Negative    15. Ragweed, Giant Negative    16. Plantain,  English Negative    17. Lamb's Quarters Negative    18. Sheep Sorrell Negative    19. Rough Pigweed Negative    20. Marsh Elder, Rough Negative    21. Mugwort, Common Negative    22. Ash mix Negative  23Charletta Cousin mix Negative    24. Beech American Negative    25. Box, Elder Negative    26. Cedar, red Negative    27. Cottonwood, [place] Negative    28. Elm mix Negative    29. Hickory 3+    30. Maple mix Negative    31. Oak, [place] mix Negative    32. Pecan Pollen Negative    33. Pine mix Negative    34. Sycamore Eastern Negative    35. Walnut, Black Pollen 3+    36. Alternaria alternata Negative    37. Cladosporium Herbarum Negative    38. Aspergillus mix Negative    39. Penicillium mix Negative    40. Bipolaris sorokiniana (Helminthosporium) Negative    41. Drechslera spicifera (Curvularia) Negative    42. Mucor plumbeus Negative    43. Fusarium moniliforme Negative    44. Aureobasidium pullulans (pullulara) Negative    45. Rhizopus oryzae Negative    46. Botrytis cinera Negative    47. Epicoccum nigrum Negative    48. Phoma betae Negative    49. Candida Albicans Negative    50. Trichophyton mentagrophytes Negative    51. Mite, D Farinae  5,000 AU/ml Negative    52. Mite, D Pteronyssinus  5,000 AU/ml Negative    53. Cat Hair 10,000 BAU/ml Negative    54.  Dog Epithelia Negative    55. Mixed Feathers Negative    56. Horse Epithelia Negative    57. Cockroach, German Negative    58. Mouse Negative    59. Tobacco Leaf Negative    Other Omitted    Other Omitted             Intradermal - 02/08/22 1143     Time Antigen Placed 1148    Allergen Manufacturer  Waynette Buttery    Location Arm    Number of Test 14    Control Negative    [place] Negative    Johnson Negative    7 Grass Negative    Ragweed mix Negative    Weed mix Negative    Mold 1 Negative    Mold 2 2+    Mold 3 Negative    Mold 4 2+    Cat Negative    Dog Negative    Cockroach Negative    Mite mix Negative               Assessment:   1. Idiopathic urticaria   2. Seasonal and perennial allergic rhinitis   3. Allergic conjunctivitis of both eyes     Plan/Recommendations:  Allergic Rhinitis Allergic Conjunctivitis - Positive skin test 02/2022: trees (hickory, pecan), molds - Avoidance measures discussed. - Use nasal saline rinses before nose sprays such as with Neilmed Sinus Rinse.  Use distilled water.   - Use Flonase 2 sprays each nostril daily during Spring. Aim upward and outward. - Use Allegra 180mg  daily.  - For eyes, use Olopatadine or Ketotifen 1 eye drop daily as needed for itchy, watery eyes.  Available over the counter, if not covered by insurance.  - Consider allergy shots as long term control of your symptoms by teaching your immune system to be more tolerant of your allergy triggers   Idiopathic Urticaria/Angioedema (Hives/Angioedema): - It is possible the hives/swelling were related to a possible viral illness since you had mouth sores and sore throat a few days later. This is rarely food induced and food skin testing is not recommended.   - At this time  etiology of hives and swelling is unknown. Hives can be caused by a variety of different triggers including illness/infection, exercise, pressure, vibrations, extremes of temperature to name a few however majority of the time there is no identifiable trigger.  -We will obtain labs to rule out inflammatory/autoimmune/alpha gal/mast cell causes. -Start Allegra 180mg  daily.   -If hives/swelling reoccur, increase to Allegra 180mg  twice daily.   -If hives/swelling reoccur, add Pepcid 20mg  twice daily and  continue Allegra 180mg  twice daily. -If still no improvement, increase to Allegra 360mg  twice daily and Pepcid 40mg  twice daily.   Return in about 2 months (around 04/09/2022).  Harlon Flor, MD [facility]

## 2022-02-08 NOTE — Patient Instructions (Addendum)
Allergic Rhinitis Allergic Conjunctivitis - Positive skin test 02/2022: trees (hickory, pecan), molds - Avoidance measures discussed. - Use nasal saline rinses before nose sprays such as with Neilmed Sinus Rinse.  Use distilled water.   - Use Flonase 2 sprays each nostril daily during Spring. Aim upward and outward. - Use Allegra 180mg  daily.  - For eyes, use Olopatadine or Ketotifen 1 eye drop daily as needed for itchy, watery eyes.  Available over the counter, if not covered by insurance.  - Consider allergy shots as long term control of your symptoms by teaching your immune system to be more tolerant of your allergy triggers   Idiopathic Urticaria/Angioedema (Hives/Angioedema): - It is possible the hives/swelling were related to a possible viral illness since you had mouth sores and sore throat a few days later. This is rarely food induced and food skin testing is not recommended.   - At this time etiology of hives and swelling is unknown. Hives can be caused by a variety of different triggers including illness/infection, exercise, pressure, vibrations, extremes of temperature to name a few however majority of the time there is no identifiable trigger.  -We will obtain labs to rule out inflammatory/autoimmune/alpha gal/mast cell causes. -Start Allegra 180mg  daily.   -If hives/swelling reoccur, increase to Allegra 180mg  twice daily.   -If hives/swelling reoccur, add Pepcid 20mg  twice daily and continue Allegra 180mg  twice daily. -If still no improvement, increase to Allegra 360mg  twice daily and Pepcid 40mg  twice daily.   ALLERGEN AVOIDANCE MEASURES  Molds - Indoor avoidance Use air conditioning to reduce indoor humidity.  Do not use a humidifier. Keep indoor humidity at 30 - 40%.  Use a dehumidifier if needed. In the bathroom use an exhaust fan or open a window after showering.  Wipe down damp surfaces after showering.  Clean bathrooms with a mold-killing solution (diluted bleach, or  products like Tilex, etc) at least once a month. In the kitchen use an exhaust fan to remove steam from cooking.  Throw away spoiled foods immediately, and empty garbage daily.  Empty water pans below self-defrosting refrigerators frequently. Vent the clothes dryer to the outside. Limit indoor houseplants; mold grows in the dirt.  No houseplants in the bedroom. Remove carpet from the bedroom. Encase the mattress and box springs with a zippered encasing.  Molds - Outdoor avoidance Avoid being outside when the grass is being mowed, or the ground is tilled. Avoid playing in leaves, pine straw, hay, etc.  Dead plant materials contain mold. Avoid going into barns or grain storage areas. Remove leaves, clippings and compost from around the home. Pollen Avoidance Pollen levels are highest during the mid-day and afternoon.  Consider this when planning outdoor activities. Avoid being outside when the grass is being mowed, or wear a mask if the pollen-allergic person must be the one to mow the grass. Keep the windows closed to keep pollen outside of the home. Use an air conditioner to filter the air. Take a shower, wash hair, and change clothing after working or playing outdoors during pollen season.

## 2022-03-27 ENCOUNTER — Other Ambulatory Visit (HOSPITAL_BASED_OUTPATIENT_CLINIC_OR_DEPARTMENT_OTHER): Payer: Self-pay

## 2022-03-27 MED ORDER — SEMAGLUTIDE-WEIGHT MANAGEMENT 0.25 MG/0.5ML ~~LOC~~ SOAJ
0.2500 mg | SUBCUTANEOUS | 0 refills | Status: AC
  Filled 2022-03-27: qty 2, 28d supply, fill #0

## 2022-03-28 ENCOUNTER — Other Ambulatory Visit (HOSPITAL_BASED_OUTPATIENT_CLINIC_OR_DEPARTMENT_OTHER): Payer: Self-pay

## 2022-03-28 ENCOUNTER — Other Ambulatory Visit: Payer: Self-pay

## 2022-03-29 ENCOUNTER — Other Ambulatory Visit (HOSPITAL_BASED_OUTPATIENT_CLINIC_OR_DEPARTMENT_OTHER): Payer: Self-pay

## 2022-04-09 ENCOUNTER — Ambulatory Visit: Payer: BC Managed Care – PPO | Admitting: Internal Medicine
